# Patient Record
Sex: Female | Born: 1985 | Race: Black or African American | Hispanic: No | Marital: Married | Smoking: Never smoker
Health system: Southern US, Community
[De-identification: ages and names within clinical notes are randomized; demographics above are authoritative.]

## PROBLEM LIST (undated history)

## (undated) DIAGNOSIS — R519 Headache, unspecified: Secondary | ICD-10-CM

## (undated) DIAGNOSIS — R12 Heartburn: Secondary | ICD-10-CM

## (undated) DIAGNOSIS — O139 Gestational [pregnancy-induced] hypertension without significant proteinuria, unspecified trimester: Secondary | ICD-10-CM

## (undated) DIAGNOSIS — R51 Headache: Secondary | ICD-10-CM

## (undated) DIAGNOSIS — O26899 Other specified pregnancy related conditions, unspecified trimester: Secondary | ICD-10-CM

---

## 2009-10-09 ENCOUNTER — Inpatient Hospital Stay (HOSPITAL_COMMUNITY)
Admission: AD | Admit: 2009-10-09 | Discharge: 2009-10-12 | Payer: Self-pay | Source: Home / Self Care | Admitting: Obstetrics

## 2009-10-09 ENCOUNTER — Encounter (INDEPENDENT_AMBULATORY_CARE_PROVIDER_SITE_OTHER): Payer: Self-pay | Admitting: Obstetrics

## 2009-10-12 ENCOUNTER — Encounter: Admission: RE | Admit: 2009-10-12 | Discharge: 2009-11-11 | Payer: Self-pay | Admitting: Obstetrics

## 2009-11-12 ENCOUNTER — Encounter
Admission: RE | Admit: 2009-11-12 | Discharge: 2009-12-06 | Payer: Self-pay | Source: Home / Self Care | Admitting: Obstetrics

## 2010-03-22 LAB — COMPREHENSIVE METABOLIC PANEL
ALT: 26 U/L (ref 0–35)
ALT: 36 U/L — ABNORMAL HIGH (ref 0–35)
ALT: 40 U/L — ABNORMAL HIGH (ref 0–35)
AST: 45 U/L — ABNORMAL HIGH (ref 0–37)
AST: 57 U/L — ABNORMAL HIGH (ref 0–37)
AST: 70 U/L — ABNORMAL HIGH (ref 0–37)
Albumin: 1.8 g/dL — ABNORMAL LOW (ref 3.5–5.2)
Albumin: 2.1 g/dL — ABNORMAL LOW (ref 3.5–5.2)
Albumin: 2.3 g/dL — ABNORMAL LOW (ref 3.5–5.2)
Alkaline Phosphatase: 141 U/L — ABNORMAL HIGH (ref 39–117)
Alkaline Phosphatase: 177 U/L — ABNORMAL HIGH (ref 39–117)
Alkaline Phosphatase: 208 U/L — ABNORMAL HIGH (ref 39–117)
BUN: 10 mg/dL (ref 6–23)
BUN: 12 mg/dL (ref 6–23)
BUN: 9 mg/dL (ref 6–23)
CO2: 18 mEq/L — ABNORMAL LOW (ref 19–32)
CO2: 20 mEq/L (ref 19–32)
CO2: 25 mEq/L (ref 19–32)
Calcium: 6.7 mg/dL — ABNORMAL LOW (ref 8.4–10.5)
Calcium: 6.7 mg/dL — ABNORMAL LOW (ref 8.4–10.5)
Calcium: 7.8 mg/dL — ABNORMAL LOW (ref 8.4–10.5)
Chloride: 107 mEq/L (ref 96–112)
Chloride: 108 mEq/L (ref 96–112)
Chloride: 108 mEq/L (ref 96–112)
Creatinine, Ser: 1.29 mg/dL — ABNORMAL HIGH (ref 0.4–1.2)
Creatinine, Ser: 1.35 mg/dL — ABNORMAL HIGH (ref 0.4–1.2)
Creatinine, Ser: 1.36 mg/dL — ABNORMAL HIGH (ref 0.4–1.2)
GFR calc Af Amer: 58 mL/min — ABNORMAL LOW (ref >60)
GFR calc Af Amer: 59 mL/min — ABNORMAL LOW (ref >60)
GFR calc Af Amer: >60 mL/min (ref >60)
GFR calc non Af Amer: 48 mL/min — ABNORMAL LOW (ref >60)
GFR calc non Af Amer: 49 mL/min — ABNORMAL LOW (ref >60)
GFR calc non Af Amer: 51 mL/min — ABNORMAL LOW (ref >60)
Glucose, Bld: 79 mg/dL (ref 70–99)
Glucose, Bld: 83 mg/dL (ref 70–99)
Glucose, Bld: 92 mg/dL (ref 70–99)
Potassium: 3.7 mEq/L (ref 3.5–5.1)
Potassium: 4.6 mEq/L (ref 3.5–5.1)
Potassium: 4.7 mEq/L (ref 3.5–5.1)
Sodium: 132 mEq/L — ABNORMAL LOW (ref 135–145)
Sodium: 133 mEq/L — ABNORMAL LOW (ref 135–145)
Sodium: 135 mEq/L (ref 135–145)
Total Bilirubin: 0.1 mg/dL — ABNORMAL LOW (ref 0.3–1.2)
Total Bilirubin: 0.5 mg/dL (ref 0.3–1.2)
Total Bilirubin: 0.8 mg/dL (ref 0.3–1.2)
Total Protein: 4.9 g/dL — ABNORMAL LOW (ref 6.0–8.3)
Total Protein: 5.6 g/dL — ABNORMAL LOW (ref 6.0–8.3)
Total Protein: 6.2 g/dL (ref 6.0–8.3)

## 2010-03-22 LAB — CBC
HCT: 32.5 % — ABNORMAL LOW (ref 36.0–46.0)
HCT: 39.1 % (ref 36.0–46.0)
HCT: 42.4 % (ref 36.0–46.0)
Hemoglobin: 11.1 g/dL — ABNORMAL LOW (ref 12.0–15.0)
Hemoglobin: 13 g/dL (ref 12.0–15.0)
Hemoglobin: 14.2 g/dL (ref 12.0–15.0)
MCH: 31.8 pg (ref 26.0–34.0)
MCH: 31.9 pg (ref 26.0–34.0)
MCH: 32.5 pg (ref 26.0–34.0)
MCHC: 33.3 g/dL (ref 30.0–36.0)
MCHC: 33.6 g/dL (ref 30.0–36.0)
MCHC: 34.2 g/dL (ref 30.0–36.0)
MCV: 95 fL (ref 78.0–100.0)
MCV: 95.1 fL (ref 78.0–100.0)
MCV: 95.6 fL (ref 78.0–100.0)
Platelets: 120 10*3/uL — ABNORMAL LOW (ref 150–400)
Platelets: 122 10*3/uL — ABNORMAL LOW (ref 150–400)
Platelets: 124 10*3/uL — ABNORMAL LOW (ref 150–400)
RBC: 3.42 MIL/uL — ABNORMAL LOW (ref 3.87–5.11)
RBC: 4.09 MIL/uL (ref 3.87–5.11)
RBC: 4.46 MIL/uL (ref 3.87–5.11)
RDW: 14.9 % (ref 11.5–15.5)
RDW: 15.2 % (ref 11.5–15.5)
RDW: 15.3 % (ref 11.5–15.5)
WBC: 21.3 10*3/uL — ABNORMAL HIGH (ref 4.0–10.5)
WBC: 23.9 10*3/uL — ABNORMAL HIGH (ref 4.0–10.5)
WBC: 7.2 10*3/uL (ref 4.0–10.5)

## 2010-03-22 LAB — MAGNESIUM
Magnesium: 4.2 mg/dL — ABNORMAL HIGH (ref 1.5–2.5)
Magnesium: 8.5 mg/dL (ref 1.5–2.5)

## 2010-03-22 LAB — RPR: RPR Ser Ql: NONREACTIVE

## 2010-03-22 LAB — LACTATE DEHYDROGENASE
LDH: 205 U/L (ref 94–250)
LDH: 274 U/L — ABNORMAL HIGH (ref 94–250)
LDH: 384 U/L — ABNORMAL HIGH (ref 94–250)

## 2010-03-22 LAB — MRSA PCR SCREENING: MRSA by PCR: NEGATIVE

## 2010-03-22 LAB — URIC ACID
Uric Acid, Serum: 10.5 mg/dL — ABNORMAL HIGH (ref 2.4–7.0)
Uric Acid, Serum: 10.6 mg/dL — ABNORMAL HIGH (ref 2.4–7.0)
Uric Acid, Serum: 11.5 mg/dL — ABNORMAL HIGH (ref 2.4–7.0)

## 2010-08-24 LAB — RPR: RPR: NONREACTIVE

## 2010-08-24 LAB — HEPATITIS B SURFACE ANTIGEN: Hepatitis B Surface Ag: NEGATIVE

## 2010-11-28 ENCOUNTER — Encounter (HOSPITAL_COMMUNITY): Payer: Self-pay | Admitting: *Deleted

## 2010-11-28 ENCOUNTER — Other Ambulatory Visit: Payer: Self-pay | Admitting: Obstetrics

## 2010-11-28 ENCOUNTER — Inpatient Hospital Stay (HOSPITAL_COMMUNITY)
Admission: AD | Admit: 2010-11-28 | Discharge: 2010-12-03 | DRG: 765 | Disposition: A | Discharge status: Home / Self Care | Payer: Medicaid Other | Source: Ambulatory Visit | Attending: Obstetrics | Admitting: Obstetrics

## 2010-11-28 DIAGNOSIS — O34219 Maternal care for unspecified type scar from previous cesarean delivery: Principal | ICD-10-CM | POA: Diagnosis present

## 2010-11-28 DIAGNOSIS — O365990 Maternal care for other known or suspected poor fetal growth, unspecified trimester, not applicable or unspecified: Secondary | ICD-10-CM | POA: Diagnosis present

## 2010-11-28 DIAGNOSIS — O139 Gestational [pregnancy-induced] hypertension without significant proteinuria, unspecified trimester: Secondary | ICD-10-CM | POA: Diagnosis present

## 2010-11-28 DIAGNOSIS — Z98891 History of uterine scar from previous surgery: Secondary | ICD-10-CM

## 2010-11-28 DIAGNOSIS — O1490 Unspecified pre-eclampsia, unspecified trimester: Secondary | ICD-10-CM

## 2010-11-28 HISTORY — DX: Gestational (pregnancy-induced) hypertension without significant proteinuria, unspecified trimester: O13.9

## 2010-11-28 LAB — CBC
HCT: 40.2 % (ref 36.0–46.0)
Hemoglobin: 13.8 g/dL (ref 12.0–15.0)
MCH: 31.3 pg (ref 26.0–34.0)
MCHC: 34.3 g/dL (ref 30.0–36.0)
RBC: 4.41 MIL/uL (ref 3.87–5.11)

## 2010-11-28 LAB — LACTATE DEHYDROGENASE: LDH: 243 U/L (ref 94–250)

## 2010-11-28 LAB — COMPREHENSIVE METABOLIC PANEL
ALT: 11 U/L (ref 0–35)
Alkaline Phosphatase: 135 U/L — ABNORMAL HIGH (ref 39–117)
BUN: 12 mg/dL (ref 6–23)
CO2: 18 mEq/L — ABNORMAL LOW (ref 19–32)
Calcium: 9 mg/dL (ref 8.4–10.5)
GFR calc Af Amer: >90 mL/min (ref >90)
GFR calc non Af Amer: >90 mL/min (ref >90)
Glucose, Bld: 75 mg/dL (ref 70–99)
Potassium: 4.3 mEq/L (ref 3.5–5.1)
Sodium: 134 mEq/L — ABNORMAL LOW (ref 135–145)
Total Protein: 6.6 g/dL (ref 6.0–8.3)

## 2010-11-28 MED ORDER — ACETAMINOPHEN 325 MG PO TABS: 650.0000 mg | ORAL_TABLET | ORAL | Status: DC | PRN

## 2010-11-28 MED ORDER — CALCIUM CARBONATE ANTACID 500 MG PO CHEW: 2.0000 | CHEWABLE_TABLET | ORAL | Status: DC | PRN

## 2010-11-28 MED ORDER — PRENATAL PLUS 27-1 MG PO TABS
1.0000 | ORAL_TABLET | Freq: Every day | ORAL | Status: DC
  Administered 2010-11-28 – 2010-11-29 (×2): 1 via ORAL
  Filled 2010-11-28 (×2): qty 1

## 2010-11-28 MED ORDER — ZOLPIDEM TARTRATE 10 MG PO TABS
10.0000 mg | ORAL_TABLET | Freq: Every evening | ORAL | Status: DC | PRN
  Administered 2010-11-29: 10 mg via ORAL
  Filled 2010-11-28: qty 1

## 2010-11-28 MED ORDER — MAGNESIUM SULFATE BOLUS VIA INFUSION
4.0000 g | Freq: Once | INTRAVENOUS | Status: AC
  Administered 2010-11-28: 4 g via INTRAVENOUS
  Filled 2010-11-28: qty 500

## 2010-11-28 MED ORDER — BETAMETHASONE SOD PHOS & ACET 6 (3-3) MG/ML IJ SUSP
12.0000 mg | Freq: Once | INTRAMUSCULAR | Status: AC
  Administered 2010-11-29: 12 mg via INTRAMUSCULAR
  Filled 2010-11-28: qty 2

## 2010-11-28 MED ORDER — MAGNESIUM SULFATE 40 G IN LACTATED RINGERS - SIMPLE
2.0000 g/h | INTRAVENOUS | Status: DC
  Administered 2010-11-28 – 2010-11-29 (×2): 2 g/h via INTRAVENOUS
  Filled 2010-11-28 (×3): qty 500

## 2010-11-28 MED ORDER — BETAMETHASONE SOD PHOS & ACET 6 (3-3) MG/ML IJ SUSP
12.0000 mg | Freq: Once | INTRAMUSCULAR | Status: AC
  Administered 2010-11-28: 12 mg via INTRAMUSCULAR
  Filled 2010-11-28: qty 2

## 2010-11-28 MED ORDER — LABETALOL HCL 5 MG/ML IV SOLN
40.0000 mg | Freq: Once | INTRAVENOUS | Status: AC | PRN
  Filled 2010-11-28: qty 8

## 2010-11-28 MED ORDER — DOCUSATE SODIUM 100 MG PO CAPS
100.0000 mg | ORAL_CAPSULE | Freq: Every day | ORAL | Status: DC
  Administered 2010-11-28 – 2010-11-29 (×2): 100 mg via ORAL
  Filled 2010-11-28 (×2): qty 1

## 2010-11-28 MED ORDER — LACTATED RINGERS IV SOLN
INTRAVENOUS | Status: AC
  Administered 2010-11-28: 100 mL/h via INTRAVENOUS
  Administered 2010-11-29 (×2): via INTRAVENOUS

## 2010-11-28 NOTE — H&P (Signed)
This is Dr. Francoise Ceo dictating the history and physical on  Kathryn Valentine obese she's a 25 year old gravida 2 para 0101 who had a previous C-section because of PIH she is now [redacted] weeks pregnant her due date is 01/16/2011 GBS unknown she was seen in the office today with a blood pressure of on  150/100 with 3+ proteinuria and she was brought in for a workup 24 urine creatinine she was also started on magnesium sulfate 4 g loading and 2 g per hour ultrasound will be obtained tomorrow because it appears that there might be some IUGR and MFM   consult to be obtained   past medical history she had PIH and her previous pregnancy Past surgical history she had a previous C-section Social history negative System review negative Physical exam Well-developed female in no distress HEENT negative Lungs clear Breasts negative Abdomen measures 31 cm Cervix closed Extremities negative

## 2010-11-29 ENCOUNTER — Inpatient Hospital Stay (HOSPITAL_COMMUNITY): Payer: Medicaid Other

## 2010-11-29 DIAGNOSIS — O1490 Unspecified pre-eclampsia, unspecified trimester: Secondary | ICD-10-CM | POA: Diagnosis present

## 2010-11-29 LAB — CREATININE CLEARANCE, URINE, 24 HOUR
Collection Interval-CRCL: 24 hours
Creatinine Clearance: 103 mL/min (ref 75–115)
Creatinine, 24H Ur: 1286 mg/d (ref 700–1800)
Creatinine, Urine: 40.19 mg/dL
Urine Total Volume-CRCL: 3200 mL

## 2010-11-29 MED ORDER — DEXTROSE IN LACTATED RINGERS 5 % IV SOLN
INTRAVENOUS | Status: DC
  Administered 2010-11-30: via INTRAVENOUS

## 2010-11-29 NOTE — Progress Notes (Addendum)
Patient ID: Kathryn Valentine, female   DOB: 12/22/1985, 25 y.o.   MRN: [id number] S: No complaints  O: Blood pressure 132/81, pulse 89, temperature 97.8 F (36.6 C), temperature source Oral, resp. rate 24, height 5\' 8"  (1.727 m), weight 83.915 kg (185 lb), SpO2 100.00%.   WUJ:WJXBJYNW: 140 bpm, Variability: Good {> 6 bpm), Accelerations: Non-reactive but appropriate for gestational age and Decelerations: Variable: mild Toco: None  11/21 0701 - 11/22 0700 In: 2752.1 [P.O.:900; I.V.:1852.1] Out: 2550 [Urine:2550]    A/P- 25 y.o. admitted with pre-eclampsia.  Stable at present on MgSO4 Dating:  [redacted]w[redacted]d Check 24 hr urine Decrease IVF  U/S reviewed.  EFW <10%; symmetrical IUGR, borderline low AFI, elevated U/A dopplers  D/W Dr Sherrie George, MFM.  She recommends delivery by repeat C/D in AM.  Plan reviewed with pt and spouse

## 2010-11-30 ENCOUNTER — Encounter (HOSPITAL_COMMUNITY): Payer: Self-pay | Admitting: Anesthesiology

## 2010-11-30 ENCOUNTER — Encounter (HOSPITAL_COMMUNITY): Payer: Self-pay | Admitting: *Deleted

## 2010-11-30 ENCOUNTER — Inpatient Hospital Stay (HOSPITAL_COMMUNITY): Payer: Medicaid Other | Admitting: Anesthesiology

## 2010-11-30 ENCOUNTER — Encounter (HOSPITAL_COMMUNITY)
Admission: AD | Disposition: A | Discharge status: Home / Self Care | Payer: Self-pay | Source: Ambulatory Visit | Attending: Obstetrics

## 2010-11-30 ENCOUNTER — Other Ambulatory Visit: Payer: Self-pay | Admitting: Obstetrics

## 2010-11-30 LAB — COMPREHENSIVE METABOLIC PANEL
ALT: 11 U/L (ref 0–35)
AST: 20 U/L (ref 0–37)
Alkaline Phosphatase: 131 U/L — ABNORMAL HIGH (ref 39–117)
CO2: 21 mEq/L (ref 19–32)
Chloride: 101 mEq/L (ref 96–112)
GFR calc non Af Amer: >90 mL/min (ref >90)
Potassium: 4.2 mEq/L (ref 3.5–5.1)
Sodium: 130 mEq/L — ABNORMAL LOW (ref 135–145)
Total Bilirubin: 0.1 mg/dL — ABNORMAL LOW (ref 0.3–1.2)

## 2010-11-30 LAB — CBC
Hemoglobin: 12.6 g/dL (ref 12.0–15.0)
MCV: 90.5 fL (ref 78.0–100.0)
Platelets: 187 10*3/uL (ref 150–400)

## 2010-11-30 LAB — PROTEIN, URINE, 24 HOUR: Protein, Urine: 40 mg/dL

## 2010-11-30 SURGERY — Surgical Case
Anesthesia: Regional | Site: Abdomen | Wound class: Clean Contaminated

## 2010-11-30 MED ORDER — SENNOSIDES-DOCUSATE SODIUM 8.6-50 MG PO TABS
2.0000 | ORAL_TABLET | Freq: Every day | ORAL | Status: DC
  Administered 2010-11-30 – 2010-12-01 (×2): 2 via ORAL

## 2010-11-30 MED ORDER — DIPHENHYDRAMINE HCL 25 MG PO CAPS
25.0000 mg | ORAL_CAPSULE | ORAL | Status: DC | PRN
  Administered 2010-11-30 – 2010-12-01 (×2): 25 mg via ORAL
  Filled 2010-11-30 (×2): qty 1

## 2010-11-30 MED ORDER — KETOROLAC TROMETHAMINE 30 MG/ML IJ SOLN: 30.0000 mg | Freq: Four times a day (QID) | INTRAMUSCULAR | Status: AC | PRN

## 2010-11-30 MED ORDER — HYDROMORPHONE HCL PF 1 MG/ML IJ SOLN: 0.2500 mg | INTRAMUSCULAR | Status: DC | PRN

## 2010-11-30 MED ORDER — MEPERIDINE HCL 25 MG/ML IJ SOLN: 6.2500 mg | INTRAMUSCULAR | Status: DC | PRN

## 2010-11-30 MED ORDER — OXYTOCIN 10 UNIT/ML IJ SOLN
INTRAMUSCULAR | Status: AC
  Filled 2010-11-30: qty 2

## 2010-11-30 MED ORDER — SCOPOLAMINE 1 MG/3DAYS TD PT72
1.0000 | MEDICATED_PATCH | Freq: Once | TRANSDERMAL | Status: AC
  Administered 2010-11-30: 1.5 mg via TRANSDERMAL

## 2010-11-30 MED ORDER — MORPHINE SULFATE 0.5 MG/ML IJ SOLN
INTRAMUSCULAR | Status: AC
  Filled 2010-11-30: qty 10

## 2010-11-30 MED ORDER — ONDANSETRON HCL 4 MG/2ML IJ SOLN
INTRAMUSCULAR | Status: DC | PRN
  Administered 2010-11-30: 4 mg via INTRAVENOUS

## 2010-11-30 MED ORDER — KETOROLAC TROMETHAMINE 60 MG/2ML IM SOLN
60.0000 mg | Freq: Once | INTRAMUSCULAR | Status: AC | PRN
  Administered 2010-11-30: 60 mg via INTRAMUSCULAR

## 2010-11-30 MED ORDER — SODIUM CHLORIDE 0.9 % IJ SOLN: 3.0000 mL | INTRAMUSCULAR | Status: DC | PRN

## 2010-11-30 MED ORDER — SIMETHICONE 80 MG PO CHEW
80.0000 mg | CHEWABLE_TABLET | Freq: Three times a day (TID) | ORAL | Status: DC
  Administered 2010-11-30 – 2010-12-02 (×8): 80 mg via ORAL

## 2010-11-30 MED ORDER — DIPHENHYDRAMINE HCL 25 MG PO CAPS: 25.0000 mg | ORAL_CAPSULE | Freq: Four times a day (QID) | ORAL | Status: DC | PRN

## 2010-11-30 MED ORDER — SODIUM CHLORIDE 0.9 % IV SOLN: 1.0000 ug/kg/h | INTRAVENOUS | Status: DC | PRN

## 2010-11-30 MED ORDER — OXYCODONE-ACETAMINOPHEN 5-325 MG PO TABS
1.0000 | ORAL_TABLET | ORAL | Status: DC | PRN
  Administered 2010-12-01: 1 via ORAL
  Administered 2010-12-02: 2 via ORAL
  Filled 2010-11-30: qty 1
  Filled 2010-11-30: qty 2
  Filled 2010-11-30: qty 1

## 2010-11-30 MED ORDER — SIMETHICONE 80 MG PO CHEW: 80.0000 mg | CHEWABLE_TABLET | ORAL | Status: DC | PRN

## 2010-11-30 MED ORDER — OXYTOCIN 20 UNITS IN LACTATED RINGERS INFUSION - SIMPLE
INTRAVENOUS | Status: DC | PRN
  Administered 2010-11-30 (×2): 20 [IU] via INTRAVENOUS

## 2010-11-30 MED ORDER — ZOLPIDEM TARTRATE 5 MG PO TABS: 5.0000 mg | ORAL_TABLET | Freq: Every evening | ORAL | Status: DC | PRN

## 2010-11-30 MED ORDER — DIBUCAINE 1 % RE OINT: 1.0000 "application " | TOPICAL_OINTMENT | RECTAL | Status: DC | PRN

## 2010-11-30 MED ORDER — IBUPROFEN 600 MG PO TABS
600.0000 mg | ORAL_TABLET | Freq: Four times a day (QID) | ORAL | Status: DC
  Administered 2010-11-30 – 2010-12-03 (×11): 600 mg via ORAL
  Filled 2010-11-30 (×11): qty 1

## 2010-11-30 MED ORDER — IBUPROFEN 600 MG PO TABS: 600.0000 mg | ORAL_TABLET | Freq: Four times a day (QID) | ORAL | Status: DC | PRN

## 2010-11-30 MED ORDER — MORPHINE SULFATE (PF) 0.5 MG/ML IJ SOLN
INTRAMUSCULAR | Status: DC | PRN
  Administered 2010-11-30: 200 ug via INTRATHECAL

## 2010-11-30 MED ORDER — PROMETHAZINE HCL 25 MG/ML IJ SOLN: 6.2500 mg | INTRAMUSCULAR | Status: DC | PRN

## 2010-11-30 MED ORDER — BUPIVACAINE IN DEXTROSE 0.75-8.25 % IT SOLN
INTRATHECAL | Status: DC | PRN
  Administered 2010-11-30: 10.5 mg via INTRATHECAL

## 2010-11-30 MED ORDER — ONDANSETRON HCL 4 MG PO TABS: 4.0000 mg | ORAL_TABLET | ORAL | Status: DC | PRN

## 2010-11-30 MED ORDER — NALOXONE HCL 0.4 MG/ML IJ SOLN: 0.4000 mg | INTRAMUSCULAR | Status: DC | PRN

## 2010-11-30 MED ORDER — ONDANSETRON HCL 4 MG/2ML IJ SOLN: 4.0000 mg | INTRAMUSCULAR | Status: DC | PRN

## 2010-11-30 MED ORDER — MENTHOL 3 MG MT LOZG: 1.0000 | LOZENGE | OROMUCOSAL | Status: DC | PRN

## 2010-11-30 MED ORDER — EPHEDRINE SULFATE 50 MG/ML IJ SOLN
INTRAMUSCULAR | Status: DC | PRN
  Administered 2010-11-30 (×2): 10 mg via INTRAVENOUS

## 2010-11-30 MED ORDER — INFLUENZA VIRUS VACC SPLIT PF IM SUSP
0.5000 mL | INTRAMUSCULAR | Status: AC
  Administered 2010-12-01: 0.5 mL via INTRAMUSCULAR
  Filled 2010-11-30: qty 0.5

## 2010-11-30 MED ORDER — DIPHENHYDRAMINE HCL 50 MG/ML IJ SOLN: 25.0000 mg | INTRAMUSCULAR | Status: DC | PRN

## 2010-11-30 MED ORDER — LACTATED RINGERS IV SOLN
INTRAVENOUS | Status: DC
  Administered 2010-11-30 – 2010-12-01 (×2): via INTRAVENOUS

## 2010-11-30 MED ORDER — SCOPOLAMINE 1 MG/3DAYS TD PT72
MEDICATED_PATCH | TRANSDERMAL | Status: AC
  Administered 2010-11-30: 1.5 mg via TRANSDERMAL
  Filled 2010-11-30: qty 1

## 2010-11-30 MED ORDER — ONDANSETRON HCL 4 MG/2ML IJ SOLN: 4.0000 mg | Freq: Three times a day (TID) | INTRAMUSCULAR | Status: DC | PRN

## 2010-11-30 MED ORDER — CITRIC ACID-SODIUM CITRATE 334-500 MG/5ML PO SOLN
ORAL | Status: AC
  Administered 2010-11-30: 30 mL
  Filled 2010-11-30: qty 15

## 2010-11-30 MED ORDER — EPHEDRINE 5 MG/ML INJ
INTRAVENOUS | Status: AC
  Filled 2010-11-30: qty 10

## 2010-11-30 MED ORDER — OXYTOCIN 20 UNITS IN LACTATED RINGERS INFUSION - SIMPLE
125.0000 mL/h | INTRAVENOUS | Status: AC
  Filled 2010-11-30: qty 1000

## 2010-11-30 MED ORDER — TETANUS-DIPHTH-ACELL PERTUSSIS 5-2.5-18.5 LF-MCG/0.5 IM SUSP
0.5000 mL | Freq: Once | INTRAMUSCULAR | Status: AC
  Administered 2010-12-01: 0.5 mL via INTRAMUSCULAR
  Filled 2010-11-30: qty 0.5

## 2010-11-30 MED ORDER — MUPIROCIN 2 % EX OINT
1.0000 "application " | TOPICAL_OINTMENT | Freq: Two times a day (BID) | CUTANEOUS | Status: DC
  Administered 2010-11-30 – 2010-12-02 (×5): 1 via NASAL
  Filled 2010-11-30: qty 22

## 2010-11-30 MED ORDER — PRENATAL PLUS 27-1 MG PO TABS
1.0000 | ORAL_TABLET | Freq: Every day | ORAL | Status: DC
  Administered 2010-12-01 – 2010-12-03 (×3): 1 via ORAL
  Filled 2010-11-30 (×3): qty 1

## 2010-11-30 MED ORDER — DIPHENHYDRAMINE HCL 50 MG/ML IJ SOLN: 12.5000 mg | INTRAMUSCULAR | Status: DC | PRN

## 2010-11-30 MED ORDER — LANOLIN HYDROUS EX OINT: 1.0000 "application " | TOPICAL_OINTMENT | CUTANEOUS | Status: DC | PRN

## 2010-11-30 MED ORDER — ONDANSETRON HCL 4 MG/2ML IJ SOLN
INTRAMUSCULAR | Status: AC
  Filled 2010-11-30: qty 2

## 2010-11-30 MED ORDER — CHLORHEXIDINE GLUCONATE CLOTH 2 % EX PADS
6.0000 | MEDICATED_PAD | Freq: Every day | CUTANEOUS | Status: DC
  Administered 2010-11-30 – 2010-12-02 (×3): 6 via TOPICAL

## 2010-11-30 MED ORDER — LACTATED RINGERS IV SOLN
INTRAVENOUS | Status: DC | PRN
  Administered 2010-11-30 (×2): via INTRAVENOUS

## 2010-11-30 MED ORDER — KETOROLAC TROMETHAMINE 60 MG/2ML IM SOLN
INTRAMUSCULAR | Status: AC
  Administered 2010-11-30: 60 mg via INTRAMUSCULAR
  Filled 2010-11-30: qty 2

## 2010-11-30 MED ORDER — NALBUPHINE HCL 10 MG/ML IJ SOLN
5.0000 mg | INTRAMUSCULAR | Status: DC | PRN
  Administered 2010-11-30: 10 mg via SUBCUTANEOUS
  Filled 2010-11-30 (×2): qty 1

## 2010-11-30 MED ORDER — WITCH HAZEL-GLYCERIN EX PADS: 1.0000 "application " | MEDICATED_PAD | CUTANEOUS | Status: DC | PRN

## 2010-11-30 MED ORDER — KETOROLAC TROMETHAMINE 30 MG/ML IJ SOLN: 15.0000 mg | Freq: Once | INTRAMUSCULAR | Status: DC | PRN

## 2010-11-30 MED ORDER — PHENYLEPHRINE 40 MCG/ML (10ML) SYRINGE FOR IV PUSH (FOR BLOOD PRESSURE SUPPORT)
PREFILLED_SYRINGE | INTRAVENOUS | Status: AC
  Filled 2010-11-30: qty 5

## 2010-11-30 MED ORDER — CEFAZOLIN SODIUM 1-5 GM-% IV SOLN
1.0000 g | Freq: Three times a day (TID) | INTRAVENOUS | Status: AC
  Administered 2010-11-30 (×3): 1 g via INTRAVENOUS
  Filled 2010-11-30 (×3): qty 50

## 2010-11-30 MED ORDER — NALBUPHINE HCL 10 MG/ML IJ SOLN
5.0000 mg | INTRAMUSCULAR | Status: DC | PRN
  Administered 2010-11-30 (×2): 5 mg via INTRAVENOUS

## 2010-11-30 MED ORDER — FENTANYL CITRATE 0.05 MG/ML IJ SOLN
INTRAMUSCULAR | Status: DC | PRN
  Administered 2010-11-30: 12.5 ug via INTRATHECAL
  Administered 2010-11-30: 87.5 ug via INTRAVENOUS

## 2010-11-30 MED ORDER — FENTANYL CITRATE 0.05 MG/ML IJ SOLN
INTRAMUSCULAR | Status: AC
  Filled 2010-11-30: qty 2

## 2010-11-30 SURGICAL SUPPLY — 26 items
CHLORAPREP W/TINT 26ML (MISCELLANEOUS) ×2 IMPLANT
CLOTH BEACON ORANGE TIMEOUT ST (SAFETY) ×2 IMPLANT
DERMABOND ADVANCED (GAUZE/BANDAGES/DRESSINGS) ×1
DERMABOND ADVANCED .7 DNX12 (GAUZE/BANDAGES/DRESSINGS) ×1 IMPLANT
ELECT REM PT RETURN 9FT ADLT (ELECTROSURGICAL) ×2
ELECTRODE REM PT RTRN 9FT ADLT (ELECTROSURGICAL) ×1 IMPLANT
EXTRACTOR VACUUM M CUP 4 TUBE (SUCTIONS) IMPLANT
GLOVE BIO SURGEON STRL SZ8.5 (GLOVE) ×4 IMPLANT
GOWN PREVENTION PLUS LG XLONG (DISPOSABLE) ×4 IMPLANT
GOWN PREVENTION PLUS XXLARGE (GOWN DISPOSABLE) ×2 IMPLANT
KIT ABG SYR 3ML LUER SLIP (SYRINGE) IMPLANT
NEEDLE HYPO 25X5/8 SAFETYGLIDE (NEEDLE) ×2 IMPLANT
NS IRRIG 1000ML POUR BTL (IV SOLUTION) ×2 IMPLANT
PACK C SECTION WH (CUSTOM PROCEDURE TRAY) ×2 IMPLANT
SLEEVE SCD COMPRESS KNEE MED (MISCELLANEOUS) IMPLANT
SUT CHROMIC 0 CT 802H (SUTURE) ×2 IMPLANT
SUT CHROMIC 1 CTX 36 (SUTURE) ×4 IMPLANT
SUT GUT PLAIN 0 CT-3 TAN 27 (SUTURE) IMPLANT
SUT MON AB 4-0 PS1 27 (SUTURE) ×2 IMPLANT
SUT VIC AB 0 CT1 18XCR BRD8 (SUTURE) IMPLANT
SUT VIC AB 0 CT1 8-18 (SUTURE)
SUT VIC AB 0 CTX 36 (SUTURE) ×2
SUT VIC AB 0 CTX36XBRD ANBCTRL (SUTURE) ×2 IMPLANT
TOWEL OR 17X24 6PK STRL BLUE (TOWEL DISPOSABLE) ×4 IMPLANT
TRAY FOLEY CATH 14FR (SET/KITS/TRAYS/PACK) ×2 IMPLANT
WATER STERILE IRR 1000ML POUR (IV SOLUTION) ×2 IMPLANT

## 2010-11-30 NOTE — Anesthesia Procedure Notes (Signed)
Spinal  Patient location during procedure: OR Start time: 11/30/2010 8:42 AM End time: 11/30/2010 8:45 AM Staffing Anesthesiologist: Sandrea Hughs Performed by: anesthesiologist  Preanesthetic Checklist Completed: patient identified, site marked, surgical consent, pre-op evaluation, timeout performed, IV checked, risks and benefits discussed and monitors and equipment checked Spinal Block Patient position: sitting Prep: DuraPrep Patient monitoring: heart rate, cardiac monitor, continuous pulse ox and blood pressure Approach: midline Location: L3-4 Injection technique: single-shot Needle Needle type: Sprotte  Needle gauge: 24 G Needle length: 9 cm Needle insertion depth: 5 cm Assessment Sensory level: T4

## 2010-11-30 NOTE — Anesthesia Postprocedure Evaluation (Signed)
Anesthesia Post Note  Patient: Kathryn Valentine  Procedure(s) Performed:  CESAREAN SECTION  Anesthesia type: Spinal  Patient location: PACU  Post pain: Pain level controlled  Post assessment: Post-op Vital signs reviewed  Last Vitals:  Filed Vitals:   11/30/10 0748  BP: 146/81  Pulse: 78  Temp: 36.5 C  Resp: 16    Post vital signs: Reviewed  Level of consciousness: awake  Complications: No apparent anesthesia complications

## 2010-11-30 NOTE — Progress Notes (Signed)
UR Chart review completed.  

## 2010-11-30 NOTE — Progress Notes (Signed)
Patient ID: Kathryn Valentine, female   DOB: [birth year], 25 y.o.   MRN: [id number] 24 hour urine collection 1200 gms MFM consult was obtained and the patient wanted to have a repeat section MFM suggested delivery at this time because of her preeclampsia and also IUGR so that will be performed today hemoglobins 12.6 platelets 186 Patient still on her magnesium sulfate December continued postop

## 2010-11-30 NOTE — Transfer of Care (Signed)
Immediate Anesthesia Transfer of Care Note  Patient: Kathryn Valentine  Procedure(s) Performed:  CESAREAN SECTION  Patient Location: PACU  Anesthesia Type: Spinal  Level of Consciousness: awake, alert  and oriented  Airway & Oxygen Therapy: Patient Spontanous Breathing  Post-op Assessment: Report given to PACU RN and Post -op Vital signs reviewed and stable  Post vital signs: Reviewed and stable  Complications: No apparent anesthesia complications

## 2010-11-30 NOTE — Op Note (Signed)
Preop diagnosis previous cesarean section at 34 weeks because of PIH patient now has a PIH and on inducible postop diagnosis repeat low transverse cesarean section at 33+ weeks because of PIH Anesthesia spinal Surgeon Dr. Francoise Ceo Procedure After the spinal administered abdomen prepped and draped data entered with a Foley catheter a transverse incision made through the old scar carried down to the rectus fascia fascia cleaned and incised the length of the incision recti muscles retracted laterally peritoneum incised longitudinally a transverse incision made on the visceroperitoneum above the bladder and the bladder mobilized inferiorly transverse lower you you aren't uterine incision made fluid was clear patient delivered of all female Apgar 7 and 8 and the weight was 1190 g the placenta was posterior mold magnum sent to pathology uterine cavity clean with dry laps uterine incision closed in 2 layers with continuous within normal on chromic bladder flap reattached with 2-0 chromic uterus well contracted tubes and ovaries normal abdomen chosen as peritoneum continuous with 2-0 chromic fascia continuous with of 0 Dexon and the skin shows a subcuticular stitch of 4-0 Monocryl blood loss was 500 cc and

## 2010-11-30 NOTE — Anesthesia Preprocedure Evaluation (Signed)
Anesthesia Evaluation  Patient identified by MRN, date of birth, ID band Patient awake    Reviewed: Allergy & Precautions, H&P , Patient's Chart, lab work & pertinent test results  Airway Mallampati: II TM Distance: >3 FB Neck ROM: full    Dental No notable dental hx.    Pulmonary neg pulmonary ROS,    Pulmonary exam normal       Cardiovascular     Neuro/Psych Negative Neurological ROS  Negative Psych ROS   GI/Hepatic negative GI ROS, Neg liver ROS,   Endo/Other  Negative Endocrine ROS  Renal/GU negative Renal ROS  Genitourinary negative   Musculoskeletal negative musculoskeletal ROS (+)   Abdominal Normal abdominal exam  (+)   Peds negative pediatric ROS (+)  Hematology negative hematology ROS (+)   Anesthesia Other Findings   Reproductive/Obstetrics (+) Pregnancy                           Anesthesia Physical Anesthesia Plan  ASA: III  Anesthesia Plan: Spinal   Post-op Pain Management:    Induction:   Airway Management Planned:   Additional Equipment:   Intra-op Plan:   Post-operative Plan:   Informed Consent: I have reviewed the patients History and Physical, chart, labs and discussed the procedure including the risks, benefits and alternatives for the proposed anesthesia with the patient or authorized representative who has indicated his/her understanding and acceptance.     Plan Discussed with:   Anesthesia Plan Comments:         Anesthesia Quick Evaluation

## 2010-12-01 LAB — CBC
HCT: 31.4 % — ABNORMAL LOW (ref 36.0–46.0)
Hemoglobin: 10.5 g/dL — ABNORMAL LOW (ref 12.0–15.0)
MCH: 30.8 pg (ref 26.0–34.0)
MCHC: 33.4 g/dL (ref 30.0–36.0)
MCV: 92.1 fL (ref 78.0–100.0)
RDW: 13.6 % (ref 11.5–15.5)

## 2010-12-01 NOTE — Plan of Care (Signed)
Problem: Discharge Progression Outcomes Goal: Barriers To Progression Addressed/Resolved Outcome: Completed/Met Date Met:  12/01/10 Magnesium gtt d/c'd on 11/24 @ 0650-VSS-no PIH S&S reported by pt.

## 2010-12-01 NOTE — Progress Notes (Signed)
PSYCHOSOCIAL ASSESSMENT ~ MATERNAL/CHILD Name:  Kathryn Valentine Age 25 Referral Date  12/01/10 Reason/Source  NICU Admission  I. FAMILY/HOME ENVIRONMENT A. Child's Legal Guardian x Parent(s)  Kathryn Valentine parent    DSS  Name  Kathryn Valentine DOB  December 23, 1985 Age  60   [phone] [place] [address]   Name  Kathryn Valentine DOB Age  Address Same as above Other Household Members/Support Persons Name  Kathryn Valentine  Relationship  Dtr DOB        Name                   Relationship                   DOB        Name                   Relationship                   DOB                   Name                   Relationship                   DOB C.   Other Support   II. PSYCHOSOCIAL DATA A. Information Source x Patient Interview   Family Interview           Other B. Event organiser  Employment    [employer]   Applying for  [employer]                                  Self Pay   Food Stamps      WIC Yes  Work Scientist, physiological Housing      Section 8     Maternity Care Coordination/Child Service Coordination/Early Intervention    School  Grade      Other Cultural and Environment Information Cultural Issues Impacting Care  III. STRENGTHS  Supportive family/friends Yes  Adequate Resources  Yes Compliance with medical plan  Yes  Home prepared for Child (including basic supplies) Yes Understanding of illness         Yes  Other  IV. RISK FACTORS AND CURRENT PROBLEMS      No Problems Note   Substance Abuse                                             Pt Family             Mental Illness     Pt Family               Family/Relationship Issues   Pt Family      Abuse/Neglect/Domestic Violence   Pt Family   Financial Resources     Pt Family  Transportation     Pt Family  DSS Involvement    Pt Family  Adjustment to Illness    Pt Family   Knowledge/Cognitive Deficit   Pt Family   Compliance with  Treatment   Pt Family  Basic Needs (food, housing, etc)  Pt Family  Housing Concerns    Pt Family  Other             V. SOCIAL WORK ASSESSMENT CSW met with pt, provided NICU brochure to help pt understand SW role in NICU.  Pt reports being understanding of illness and is stable emotionally at this time.  Pt reports FOB is in [employer] and could possibly be deployed soon, however, pt expressed having dtr and sister to help support while FOB is away.  Educated pt on SSI and will begin process of applying for pt.  Pt currently does not have any insurance, provided pt with Medicaid application.  Pt reports already having WIC.  Pt denied/exressed any concerns with supplies or resources at this time.  Will continue to follow infant while in NICU.  VI. SOCIAL WORK PLAN (In [place]) No Further Intervention Required/No Barriers to Discharge Psychosocial Support and Ongoing Assessment of Needs Patient/Family  Education Child Protective Services Report  [place]        Date Information/Referral to [facility]   Other

## 2010-12-01 NOTE — Progress Notes (Signed)
Pt transferred to room 309. Stable upon transfer and pt without complaints. Settled into room and then pt ambulated to visit baby in the NICU.  Tia Alert A

## 2010-12-01 NOTE — Progress Notes (Signed)
Patient ID: Kathryn Valentine, female   DOB: 15-Oct-1985, 25 y.o.   MRN: [id number] Postop day 1 Vital  signs normal The bowel sounds Lochia moderate EDC magnesium sulfate and transferred today

## 2010-12-02 NOTE — Progress Notes (Signed)
Patient ID: Kathryn Valentine, female   DOB: 25-Jun-1985, 25 y.o.   MRN: [id number] Postpartum day 2 Blood pressure is normal Fundus firm Lochia moderate Legs negative No complaints

## 2010-12-03 ENCOUNTER — Encounter (HOSPITAL_COMMUNITY): Payer: Self-pay | Admitting: *Deleted

## 2010-12-03 NOTE — Discharge Summary (Signed)
Obstetric Discharge Summary Reason for Admission: observation/evaluation Prenatal Procedures: Preeclampsia Intrapartum Procedures: cesarean: low cervical, transverse Postpartum Procedures: none Complications-Operative and Postpartum: none Hemoglobin  Date Value Range Status  12/01/2010 10.5* 12.0-15.0 (g/dL) Final     HCT  Date Value Range Status  12/01/2010 31.4* 36.0-46.0 (%) Final    Discharge Diagnoses: Preelampsia  Discharge Information: Date: 12/03/2010 Activity: pelvic rest Diet: routine Medications: Percocet Condition: stable Instructions: refer to practice specific booklet Discharge to: home Follow-up Information    Follow up with MARSHALL,BERNARD A, MD. Call in 6 weeks.   Contact information:   [address] [place] [address] [phone]          Newborn Data: Live born female  Birth Weight:  APGAR: 7, 8  Home with mother.  MARSHALL,BERNARD A 12/03/2010, 7:34 AM

## 2013-10-22 LAB — OB RESULTS CONSOLE ANTIBODY SCREEN: Antibody Screen: NEGATIVE

## 2013-10-22 LAB — OB RESULTS CONSOLE HGB/HCT, BLOOD
HCT: 40 %
Hemoglobin: 13.5 g/dL

## 2013-10-22 LAB — OB RESULTS CONSOLE HEPATITIS B SURFACE ANTIGEN: Hepatitis B Surface Ag: NEGATIVE

## 2013-10-22 LAB — OB RESULTS CONSOLE HIV ANTIBODY (ROUTINE TESTING): HIV: NONREACTIVE

## 2013-10-22 LAB — OB RESULTS CONSOLE RUBELLA ANTIBODY, IGM: RUBELLA: IMMUNE

## 2013-10-22 LAB — OB RESULTS CONSOLE RPR: RPR: NONREACTIVE

## 2013-10-22 LAB — OB RESULTS CONSOLE ABO/RH: RH Type: POSITIVE

## 2013-10-22 LAB — OB RESULTS CONSOLE PLATELET COUNT: Platelets: 303 10*3/uL

## 2013-10-22 LAB — OB RESULTS CONSOLE GC/CHLAMYDIA
CHLAMYDIA, DNA PROBE: NEGATIVE
GC PROBE AMP, GENITAL: NEGATIVE

## 2013-10-25 LAB — CYTOLOGY - PAP: PAP SMEAR: NEGATIVE

## 2013-10-25 LAB — PROCEDURE REPORT - SCANNED: PAP SMEAR: NEGATIVE

## 2013-11-08 ENCOUNTER — Encounter (HOSPITAL_COMMUNITY): Payer: Self-pay | Admitting: *Deleted

## 2014-02-23 LAB — GLUCOSE TOLERANCE, 1 HOUR (50G) W/O FASTING: Glucose, GTT - 1 Hour: 74 mg/dL (ref ?–200)

## 2014-04-25 ENCOUNTER — Encounter (HOSPITAL_COMMUNITY): Payer: Self-pay | Admitting: *Deleted

## 2014-04-25 ENCOUNTER — Inpatient Hospital Stay (HOSPITAL_COMMUNITY)
Admission: AD | Admit: 2014-04-25 | Discharge: 2014-04-25 | Disposition: A | Discharge status: Home / Self Care | Payer: BLUE CROSS/BLUE SHIELD | Source: Ambulatory Visit | Attending: Obstetrics and Gynecology | Admitting: Obstetrics and Gynecology

## 2014-04-25 DIAGNOSIS — Z3A33 33 weeks gestation of pregnancy: Secondary | ICD-10-CM | POA: Diagnosis not present

## 2014-04-25 DIAGNOSIS — R51 Headache: Secondary | ICD-10-CM | POA: Diagnosis not present

## 2014-04-25 DIAGNOSIS — R42 Dizziness and giddiness: Secondary | ICD-10-CM | POA: Insufficient documentation

## 2014-04-25 DIAGNOSIS — O9989 Other specified diseases and conditions complicating pregnancy, childbirth and the puerperium: Secondary | ICD-10-CM | POA: Insufficient documentation

## 2014-04-25 HISTORY — DX: Headache: R51

## 2014-04-25 HISTORY — DX: Headache, unspecified: R51.9

## 2014-04-25 LAB — COMPREHENSIVE METABOLIC PANEL
ALK PHOS: 118 U/L — AB (ref 39–117)
ALT: 14 U/L (ref 0–35)
AST: 24 U/L (ref 0–37)
Albumin: 2.7 g/dL — ABNORMAL LOW (ref 3.5–5.2)
Anion gap: 7 (ref 5–15)
BUN: 7 mg/dL (ref 6–23)
CALCIUM: 8.2 mg/dL — AB (ref 8.4–10.5)
CO2: 21 mmol/L (ref 19–32)
Chloride: 107 mmol/L (ref 96–112)
Creatinine, Ser: 0.56 mg/dL (ref 0.50–1.10)
GLUCOSE: 86 mg/dL (ref 70–99)
POTASSIUM: 3.6 mmol/L (ref 3.5–5.1)
SODIUM: 135 mmol/L (ref 135–145)
TOTAL PROTEIN: 6.8 g/dL (ref 6.0–8.3)
Total Bilirubin: 0.6 mg/dL (ref 0.3–1.2)

## 2014-04-25 LAB — URINE MICROSCOPIC-ADD ON

## 2014-04-25 LAB — URINALYSIS, ROUTINE W REFLEX MICROSCOPIC
BILIRUBIN URINE: NEGATIVE
Glucose, UA: NEGATIVE mg/dL
Ketones, ur: NEGATIVE mg/dL
Leukocytes, UA: NEGATIVE
NITRITE: NEGATIVE
PROTEIN: NEGATIVE mg/dL
SPECIFIC GRAVITY, URINE: 1.015 (ref 1.005–1.030)
UROBILINOGEN UA: 0.2 mg/dL (ref 0.0–1.0)
pH: 7 (ref 5.0–8.0)

## 2014-04-25 LAB — CBC
HEMATOCRIT: 35.3 % — AB (ref 36.0–46.0)
HEMOGLOBIN: 11.9 g/dL — AB (ref 12.0–15.0)
MCH: 30.4 pg (ref 26.0–34.0)
MCHC: 33.7 g/dL (ref 30.0–36.0)
MCV: 90.3 fL (ref 78.0–100.0)
Platelets: 168 10*3/uL (ref 150–400)
RBC: 3.91 MIL/uL (ref 3.87–5.11)
RDW: 12.3 % (ref 11.5–15.5)
WBC: 5.7 10*3/uL (ref 4.0–10.5)

## 2014-04-25 LAB — PROTEIN / CREATININE RATIO, URINE
Creatinine, Urine: 156 mg/dL
Protein Creatinine Ratio: 0.08 (ref 0.00–0.15)
Total Protein, Urine: 13 mg/dL

## 2014-04-25 NOTE — MAU Note (Signed)
[facility], has first appointment on Thursday.

## 2014-04-25 NOTE — Discharge Instructions (Signed)
[facility] were seen at [facility] for evaluation of dizziness. You were concerned about the possibility of preeclampsia given your history. Your blood pressure was normal while in the MAU. Your lab work was also normal. This was reassuring. Please follow up with your prenatal provider on Thursday as scheduled. Please seek care sooner if you develop severe headache, visual changes or other concerning symptoms. Please seek care sooner if you develop contractions, loss of fluid, vaginal bleeding or decreased fetal movement.

## 2014-04-25 NOTE — MAU Note (Signed)
Pt had "dizzy spell" last night around 2230, was having dinner, thought she was going to pass out, lasted about 10 minutes.  Did not lose consciousness.  Concerned because of hx of preeclampsia with previous pregnancies.  Has had some lower abd pain over the weekend - is more dull today.  Is not feeling very well today - had dull HA last night, some spots in vision.  Denies bleeding or LOF.

## 2014-04-25 NOTE — MAU Provider Note (Signed)
History     CSN: [id number]  Arrival date and time: 04/25/14 [id number]   First Provider Initiated Contact with Patient 04/25/14 1127      Chief Complaint  Patient presents with  . Dizziness   HPI   Kathryn Valentine is a 29 y.o. [id number] with a history of pre-eclampsia in her 2 previous pregnancies who presents to MAU today with dizziness and spots in her vision. Her symptoms started last night while she was eating dinner at home and lasted around 2 hours. Pt reported she felt dizzy while sitting down and could not keep her eyes open. She thought she was going to pass out, but did not lose consciousness. She also had a mild headache. She reports feeling mild pressure on the back of her head. After 2 hours she felt better, stood up, and went to bed.Today she reports she is still feeling dizzy, but not as much as yesterday. She is also having vision spots that have been coming and going. Her headache has also improved.  During interview pt also endorsed dull lower abdominal pain that started last Friday. Pain is a 2/10. Resting in bed seems to relieve the pain and walking makes it worse.  Pt has been monitoring her blood pressure at home at least once daily and reports the highest one has been 121/65.   Pt reports good fetal movement, no leakage of fluid or vaginal bleeding. Pt denies contractions, blurry vision, RUQ pain and peripheral edema.     OB History    Gravida Para Term Preterm AB TAB SAB Ectopic Multiple Living   4 2 0 2 1 0 1 0 0 2       Past Medical History  Diagnosis Date  . PONV (postoperative nausea and vomiting)   . Cesarean delivery delivered 10/09/2009    breech presentation  . Pregnancy induced hypertension   . Preterm labor   . Headache     Past Surgical History  Procedure Laterality Date  . Cesarean section    . Cesarean section  11/30/2010    Procedure: CESAREAN SECTION;  Surgeon: Kathreen CosierBernard A Marshall, MD;  Location: [facility];  Service: Gynecology;  Laterality:  N/A;    Family History  Problem Relation Age of Onset  . Diabetes Maternal Grandmother   . Diabetes Maternal Grandfather   . Diabetes Paternal Grandmother   . Diabetes Paternal Grandfather     History  Substance Use Topics  . Smoking status: Never Smoker   . Smokeless tobacco: Never Used  . Alcohol Use: No    Allergies: No Known Allergies  Prescriptions prior to admission  Medication Sig Dispense Refill Last Dose  . ACETAMINOPHEN-CAFF-BUTALBITAL 50-500-40 MG CAPS Take 1 tablet by mouth every 6 (six) hours as needed (migraines).   04/24/2014 at Unknown time  . calcium carbonate (TUMS - DOSED IN MG ELEMENTAL CALCIUM) 500 MG chewable tablet Chew 1-2 tablets by mouth daily as needed for indigestion or heartburn. Depends on indigestion if takes 1 or 2 tablets   04/24/2014 at Unknown time  . Prenatal Vit-Min-FA-Fish Oil ([employer] PRENATAL GUMMY PO) Take 2 tablets by mouth daily.   04/24/2014 at Unknown time    ROS  Respiratory: no cough, shortness of breath, or wheezing Cardiovascular: no chest pain or dyspnea on exertion Gastrointestinal : no abdominal pain, change in bowel habits Genito-Urinary: no dysuria, trouble voiding, or hematuria Neurological: mild headache   Physical Exam   Blood pressure 121/67, pulse 84, temperature 97.7 F (36.5 C), temperature source Oral,  resp. rate 16, unknown if currently breastfeeding.  Physical Exam General appearance: alert and cooperative, in no distress CV: regular rate and rhythm Lungs: CTAB, no increased work of breathing  Abdomen: soft, non tender, + bowel sounds  Extremities: no edema     MAU Course  Procedures  MDM NST reactive Toco quiet  CBC    Component Value Date/Time   WBC 5.7 04/25/2014 1129   RBC 3.91 04/25/2014 1129   HGB 11.9* 04/25/2014 1129   HGB 13.5 10/22/2013   HCT 35.3* 04/25/2014 1129   HCT 40 10/22/2013   PLT 168 04/25/2014 1129   PLT 303 10/22/2013   MCV 90.3 04/25/2014 1129   MCH 30.4 04/25/2014 1129    MCHC 33.7 04/25/2014 1129   RDW 12.3 04/25/2014 1129    CMP     Component Value Date/Time   NA 135 04/25/2014 1129   K 3.6 04/25/2014 1129   CL 107 04/25/2014 1129   CO2 21 04/25/2014 1129   GLUCOSE 86 04/25/2014 1129   BUN 7 04/25/2014 1129   CREATININE 0.56 04/25/2014 1129   CREATININE 0.87 11/28/2010 1750   CALCIUM 8.2* 04/25/2014 1129   PROT 6.8 04/25/2014 1129   ALBUMIN 2.7* 04/25/2014 1129   AST 24 04/25/2014 1129   ALT 14 04/25/2014 1129   ALKPHOS 118* 04/25/2014 1129   BILITOT 0.6 04/25/2014 1129   GFRNONAA >90 04/25/2014 1129   GFRAA >90 04/25/2014 1129    UPC 0.08  Assessment and Plan  Kathryn Valentine is a 29 y.o. [id number] with a history of pre-eclampsia with 2 previous pregnancies who presents to MAU today with dizziness, spots in her vision and a mild headache. Symptoms had complete resolved prior to discharge without intervention.   #Rule out Pre-Eclampsia Blood pressure normal range during observation in MAU. HELLP labs negative. UPC 0.08. Symptoms have been intermittent and resolved prior to discharge. States she is anxious about any abnormal symptom given her history.  - Reassurance provided.  - Preeclampsia precautions reviewed.   #Dizziness, resolved Possibly secondary to dehydration.  - Recommend plenty of fluids.   Follow up in clinic on Thursday as scheduled for new OB visit with [facility]. Previously scheduled.   William Dalton MD  04/25/2014, 1:52 PM

## 2014-04-27 ENCOUNTER — Encounter: Payer: Self-pay | Admitting: *Deleted

## 2014-04-28 ENCOUNTER — Encounter: Payer: Self-pay | Admitting: Obstetrics and Gynecology

## 2014-04-28 ENCOUNTER — Ambulatory Visit (INDEPENDENT_AMBULATORY_CARE_PROVIDER_SITE_OTHER): Payer: BLUE CROSS/BLUE SHIELD | Admitting: Obstetrics and Gynecology

## 2014-04-28 VITALS — BP 105/59 | HR 79 | Wt 197.6 lb

## 2014-04-28 DIAGNOSIS — Z8742 Personal history of other diseases of the female genital tract: Secondary | ICD-10-CM

## 2014-04-28 DIAGNOSIS — O3421 Maternal care for scar from previous cesarean delivery: Secondary | ICD-10-CM

## 2014-04-28 DIAGNOSIS — O09293 Supervision of pregnancy with other poor reproductive or obstetric history, third trimester: Secondary | ICD-10-CM

## 2014-04-28 DIAGNOSIS — Z8759 Personal history of other complications of pregnancy, childbirth and the puerperium: Secondary | ICD-10-CM | POA: Insufficient documentation

## 2014-04-28 DIAGNOSIS — O09893 Supervision of other high risk pregnancies, third trimester: Secondary | ICD-10-CM | POA: Insufficient documentation

## 2014-04-28 DIAGNOSIS — O09213 Supervision of pregnancy with history of pre-term labor, third trimester: Secondary | ICD-10-CM | POA: Insufficient documentation

## 2014-04-28 DIAGNOSIS — O34219 Maternal care for unspecified type scar from previous cesarean delivery: Secondary | ICD-10-CM | POA: Insufficient documentation

## 2014-04-28 LAB — POCT URINALYSIS DIP (DEVICE)
BILIRUBIN URINE: NEGATIVE
Glucose, UA: NEGATIVE mg/dL
Hgb urine dipstick: NEGATIVE
Ketones, ur: NEGATIVE mg/dL
NITRITE: NEGATIVE
PH: 7.5 (ref 5.0–8.0)
PROTEIN: NEGATIVE mg/dL
Specific Gravity, Urine: 1.015 (ref 1.005–1.030)
Urobilinogen, UA: 0.2 mg/dL (ref 0.0–1.0)

## 2014-04-28 NOTE — Progress Notes (Signed)
Leukocytes: Trace 

## 2014-04-28 NOTE — Progress Notes (Signed)
Transfer from Dr. Gaynell FaceMarshall due to dissatisfaction with phone call response. She will call office to inform them.  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 Fatigued; dizzy intermittently, occ mild H/A.Marland Kitchen. Seen in MAU 2 days ago for same and CBC, CMP normal then.  Advised slow position changes, avoid supine hypotension.  Hx SGA x2 (with preE x2); size sl less than dates today> will schedule US for interval growth

## 2014-04-28 NOTE — Progress Notes (Signed)
Transfer from Dr. Gaynell FaceMarshall last seen 2 weeks ago. Declines flu shot, reports that she already had tdap through school.

## 2014-05-04 ENCOUNTER — Other Ambulatory Visit: Payer: Self-pay | Admitting: Obstetrics and Gynecology

## 2014-05-04 ENCOUNTER — Ambulatory Visit (HOSPITAL_COMMUNITY)
Admission: RE | Admit: 2014-05-04 | Discharge: 2014-05-04 | Disposition: A | Discharge status: Home / Self Care | Payer: BLUE CROSS/BLUE SHIELD | Source: Ambulatory Visit | Attending: Obstetrics and Gynecology | Admitting: Obstetrics and Gynecology

## 2014-05-04 DIAGNOSIS — O09893 Supervision of other high risk pregnancies, third trimester: Secondary | ICD-10-CM

## 2014-05-04 DIAGNOSIS — Z36 Encounter for antenatal screening of mother: Secondary | ICD-10-CM | POA: Diagnosis present

## 2014-05-04 DIAGNOSIS — O3421 Maternal care for scar from previous cesarean delivery: Secondary | ICD-10-CM | POA: Diagnosis not present

## 2014-05-04 DIAGNOSIS — Z8759 Personal history of other complications of pregnancy, childbirth and the puerperium: Secondary | ICD-10-CM

## 2014-05-04 DIAGNOSIS — O365930 Maternal care for other known or suspected poor fetal growth, third trimester, not applicable or unspecified: Secondary | ICD-10-CM | POA: Diagnosis not present

## 2014-05-04 DIAGNOSIS — O09213 Supervision of pregnancy with history of pre-term labor, third trimester: Secondary | ICD-10-CM | POA: Diagnosis not present

## 2014-05-04 DIAGNOSIS — Z3A34 34 weeks gestation of pregnancy: Secondary | ICD-10-CM | POA: Insufficient documentation

## 2014-05-04 DIAGNOSIS — O34219 Maternal care for unspecified type scar from previous cesarean delivery: Secondary | ICD-10-CM

## 2014-05-12 ENCOUNTER — Ambulatory Visit (INDEPENDENT_AMBULATORY_CARE_PROVIDER_SITE_OTHER): Payer: BLUE CROSS/BLUE SHIELD | Admitting: Obstetrics & Gynecology

## 2014-05-12 VITALS — BP 117/65 | HR 79 | Wt 199.3 lb

## 2014-05-12 DIAGNOSIS — O09293 Supervision of pregnancy with other poor reproductive or obstetric history, third trimester: Secondary | ICD-10-CM

## 2014-05-12 DIAGNOSIS — O34219 Maternal care for unspecified type scar from previous cesarean delivery: Secondary | ICD-10-CM

## 2014-05-12 DIAGNOSIS — O09893 Supervision of other high risk pregnancies, third trimester: Secondary | ICD-10-CM

## 2014-05-12 DIAGNOSIS — O3421 Maternal care for scar from previous cesarean delivery: Secondary | ICD-10-CM

## 2014-05-12 LAB — POCT URINALYSIS DIP (DEVICE)
BILIRUBIN URINE: NEGATIVE
GLUCOSE, UA: NEGATIVE mg/dL
HGB URINE DIPSTICK: NEGATIVE
Ketones, ur: NEGATIVE mg/dL
NITRITE: NEGATIVE
Protein, ur: NEGATIVE mg/dL
Specific Gravity, Urine: 1.025 (ref 1.005–1.030)
UROBILINOGEN UA: 0.2 mg/dL (ref 0.0–1.0)
pH: 7 (ref 5.0–8.0)

## 2014-05-12 NOTE — Patient Instructions (Signed)

## 2014-05-12 NOTE — Progress Notes (Signed)
GBS GC CT today, US reviewed 35 %ile growth, some pressure noted. Schedule repeat CS and BTL 39 weeks 06/03/14

## 2014-05-12 NOTE — Progress Notes (Signed)
Leukocytes: Moderate

## 2014-05-13 LAB — GC/CHLAMYDIA PROBE AMP
CT PROBE, AMP APTIMA: NEGATIVE
GC Probe RNA: NEGATIVE

## 2014-05-13 LAB — CULTURE, BETA STREP (GROUP B ONLY)

## 2014-05-15 ENCOUNTER — Encounter: Payer: Self-pay | Admitting: Family Medicine

## 2014-05-15 DIAGNOSIS — Z302 Encounter for sterilization: Secondary | ICD-10-CM

## 2014-05-15 NOTE — H&P (Signed)
  Kathryn EdwardsMikafui Antonio-Obese is an 29 y.o. [id number] 9231w2d female.   Chief Complaint: Here for repeat C-section HPI: Previous Cesarean Delivery x 2. For Elective repeat.  Past Medical History  Diagnosis Date  . PONV (postoperative nausea and vomiting)   . Cesarean delivery delivered 10/09/2009    breech presentation  . Pregnancy induced hypertension   . Preterm labor   . Headache     Past Surgical History  Procedure Laterality Date  . Cesarean section    . Cesarean section  11/30/2010    Procedure: CESAREAN SECTION;  Surgeon: Kathreen CosierBernard A Marshall, MD;  Location: [facility];  Service: Gynecology;  Laterality: N/A;    Family History  Problem Relation Age of Onset  . Diabetes Maternal Grandmother   . Diabetes Maternal Grandfather   . Diabetes Paternal Grandmother   . Diabetes Paternal Grandfather    Social History:  reports that she has never smoked. She has never used smokeless tobacco. She reports that she does not drink alcohol or use illicit drugs.  Allergies: No Known Allergies  No current facility-administered medications on file prior to encounter.   Current Outpatient Prescriptions on File Prior to Encounter  Medication Sig Dispense Refill  . ACETAMINOPHEN-CAFF-BUTALBITAL 50-500-40 MG CAPS Take 1 tablet by mouth every 6 (six) hours as needed (migraines).    . Butalbital-APAP-Caffeine (FIORICET PO) Take by mouth.    . calcium carbonate (TUMS - DOSED IN MG ELEMENTAL CALCIUM) 500 MG chewable tablet Chew 1-2 tablets by mouth daily as needed for indigestion or heartburn. Depends on indigestion if takes 1 or 2 tablets    . Prenatal Vit-Min-FA-Fish Oil ([employer] PRENATAL GUMMY PO) Take 2 tablets by mouth daily.      A comprehensive review of systems was negative.  unknown if currently breastfeeding. There were no vitals taken for this visit. General appearance: alert, cooperative and appears stated age Head: Normocephalic, without obvious abnormality, atraumatic Neck: supple, symmetrical,  trachea midline Lungs: normal effort Heart: regular rate and rhythm Abdomen: gravid. NT Extremities: extremities normal, atraumatic, no cyanosis or edema Skin: Skin color, texture, turgor normal. No rashes or lesions Neurologic: Grossly normal   Lab Results  Component Value Date   WBC 5.7 04/25/2014   HGB 11.9* 04/25/2014   HCT 35.3* 04/25/2014   MCV 90.3 04/25/2014   PLT 168 04/25/2014     Assessment/Plan Patient Active Problem List   Diagnosis Date Noted  . Encounter for sterilization 05/15/2014  . Previous cesarean section complicating pregnancy 04/28/2014  . Previous pregnancy complicated by pregnancy-induced hypertension in third trimester, antepartum 04/28/2014  . Previous preterm delivery in third trimester, antepartum 04/28/2014  . History of prior pregnancy with SGA newborn 04/28/2014   RLTCS and BTL. Risks include but are not limited to bleeding, infection, injury to surrounding structures, including bowel, bladder and ureters, blood clots, and death.  Likelihood of success is high. Patient counseled, r.e. Risks benefits of BTL, including permanency of procedure, risk of failure(1:100), increased risk of ectopic.  Patient verbalized understanding and desires to proceed.  Fidencio Duddy S 05/15/2014, 11:56 AM

## 2014-05-19 ENCOUNTER — Telehealth: Payer: Self-pay

## 2014-05-19 ENCOUNTER — Ambulatory Visit (INDEPENDENT_AMBULATORY_CARE_PROVIDER_SITE_OTHER): Payer: BLUE CROSS/BLUE SHIELD | Admitting: Family

## 2014-05-19 VITALS — BP 108/66 | HR 102 | Temp 98.6°F | Wt 196.7 lb

## 2014-05-19 DIAGNOSIS — O09893 Supervision of other high risk pregnancies, third trimester: Secondary | ICD-10-CM

## 2014-05-19 DIAGNOSIS — O09293 Supervision of pregnancy with other poor reproductive or obstetric history, third trimester: Secondary | ICD-10-CM

## 2014-05-19 LAB — POCT URINALYSIS DIP (DEVICE)
Bilirubin Urine: NEGATIVE
GLUCOSE, UA: NEGATIVE mg/dL
HGB URINE DIPSTICK: NEGATIVE
Ketones, ur: NEGATIVE mg/dL
NITRITE: NEGATIVE
PH: 7 (ref 5.0–8.0)
Protein, ur: NEGATIVE mg/dL
Specific Gravity, Urine: 1.015 (ref 1.005–1.030)
UROBILINOGEN UA: 0.2 mg/dL (ref 0.0–1.0)

## 2014-05-19 NOTE — Progress Notes (Signed)
Doing well; no questions or concerns.  Mod leuks in urine.  Denies UTI symptoms.  Urine culture sent.  Reviewed GBS results - pos.

## 2014-05-19 NOTE — Telephone Encounter (Signed)
-----   Message from Willodean Rosenthalarolyn Harraway-Smith, MD sent at 05/18/2014  3:14 PM EDT ----- Please call pt.  Begin baby ASA.  Thx, clh-S

## 2014-05-19 NOTE — Progress Notes (Signed)
C/o occasional pelvic pressure.  

## 2014-05-19 NOTE — Telephone Encounter (Signed)
Called patient and informed her of recommendations. Patient verbalized understanding. No questions or concerns.

## 2014-05-21 LAB — CULTURE, OB URINE

## 2014-05-26 ENCOUNTER — Other Ambulatory Visit: Payer: Self-pay | Admitting: Family

## 2014-05-26 DIAGNOSIS — B951 Streptococcus, group B, as the cause of diseases classified elsewhere: Secondary | ICD-10-CM | POA: Insufficient documentation

## 2014-05-26 DIAGNOSIS — O2343 Unspecified infection of urinary tract in pregnancy, third trimester: Principal | ICD-10-CM

## 2014-05-26 DIAGNOSIS — O2340 Unspecified infection of urinary tract in pregnancy, unspecified trimester: Secondary | ICD-10-CM

## 2014-05-26 MED ORDER — AMOXICILLIN 500 MG PO CAPS: 500.0000 mg | ORAL_CAPSULE | Freq: Three times a day (TID) | ORAL | Status: DC

## 2014-05-26 NOTE — Progress Notes (Unsigned)
Pt with GBS in urine.  RX sent to pharmacy for Amoxicillin 500 mg TID x 7 days.  Pt will be treated for GBS in labor.

## 2014-05-27 ENCOUNTER — Encounter: Payer: BLUE CROSS/BLUE SHIELD | Admitting: Obstetrics and Gynecology

## 2014-05-30 ENCOUNTER — Ambulatory Visit (INDEPENDENT_AMBULATORY_CARE_PROVIDER_SITE_OTHER): Payer: BLUE CROSS/BLUE SHIELD | Admitting: Obstetrics and Gynecology

## 2014-05-30 VITALS — BP 115/72 | HR 96 | Temp 97.7°F | Wt 200.2 lb

## 2014-05-30 DIAGNOSIS — O34219 Maternal care for unspecified type scar from previous cesarean delivery: Secondary | ICD-10-CM

## 2014-05-30 DIAGNOSIS — O09213 Supervision of pregnancy with history of pre-term labor, third trimester: Secondary | ICD-10-CM

## 2014-05-30 DIAGNOSIS — O3421 Maternal care for scar from previous cesarean delivery: Secondary | ICD-10-CM

## 2014-05-30 DIAGNOSIS — O09893 Supervision of other high risk pregnancies, third trimester: Secondary | ICD-10-CM

## 2014-05-30 DIAGNOSIS — O09293 Supervision of pregnancy with other poor reproductive or obstetric history, third trimester: Secondary | ICD-10-CM

## 2014-05-30 LAB — POCT URINALYSIS DIP (DEVICE)
GLUCOSE, UA: NEGATIVE mg/dL
HGB URINE DIPSTICK: NEGATIVE
Nitrite: NEGATIVE
Protein, ur: 100 mg/dL — AB
SPECIFIC GRAVITY, URINE: 1.02 (ref 1.005–1.030)
Urobilinogen, UA: 0.2 mg/dL (ref 0.0–1.0)
pH: 7 (ref 5.0–8.0)

## 2014-05-30 NOTE — Progress Notes (Signed)
29 y.o. [id number] at 6562w3d here for routine OB visit. Doing well today.  1. History of PreEclampsia with prior pregnancies x 2. Blood pressure well controlled today. Continues to aspirin 81 mg daily.  2. History of C-section. Repeat C-section with BTL scheduled for 8496w0d.  3. Routine PNC. Labor precautions reviewed. SVE today unchanged from prior.

## 2014-05-30 NOTE — Progress Notes (Signed)
Large leuks on UA, recently picked up Rx for UTI

## 2014-06-01 ENCOUNTER — Telehealth: Payer: Self-pay | Admitting: *Deleted

## 2014-06-01 NOTE — Telephone Encounter (Signed)
Called pt and informed her of +GBS in her urine requiring antibiotic treatment. Pt states that she saw the Rx on her MyChart account and picked it up from the pharmacy on 5/21. She is scheduled for C/S on 5/27 and had no questions.

## 2014-06-02 ENCOUNTER — Encounter (HOSPITAL_COMMUNITY): Payer: Self-pay

## 2014-06-02 ENCOUNTER — Encounter (HOSPITAL_COMMUNITY)
Admission: RE | Admit: 2014-06-02 | Discharge: 2014-06-02 | Disposition: A | Discharge status: Home / Self Care | Payer: BLUE CROSS/BLUE SHIELD | Source: Ambulatory Visit | Attending: Family Medicine | Admitting: Family Medicine

## 2014-06-02 VITALS — BP 104/70 | HR 90 | Resp 20 | Ht 67.0 in | Wt 200.1 lb

## 2014-06-02 DIAGNOSIS — O2343 Unspecified infection of urinary tract in pregnancy, third trimester: Principal | ICD-10-CM

## 2014-06-02 DIAGNOSIS — Z302 Encounter for sterilization: Secondary | ICD-10-CM

## 2014-06-02 DIAGNOSIS — B951 Streptococcus, group B, as the cause of diseases classified elsewhere: Secondary | ICD-10-CM

## 2014-06-02 HISTORY — DX: Other specified pregnancy related conditions, unspecified trimester: O26.899

## 2014-06-02 HISTORY — DX: Heartburn: R12

## 2014-06-02 LAB — CBC
HEMATOCRIT: 35.5 % — AB (ref 36.0–46.0)
HEMOGLOBIN: 12.1 g/dL (ref 12.0–15.0)
MCH: 30.5 pg (ref 26.0–34.0)
MCHC: 34.1 g/dL (ref 30.0–36.0)
MCV: 89.4 fL (ref 78.0–100.0)
Platelets: 171 10*3/uL (ref 150–400)
RBC: 3.97 MIL/uL (ref 3.87–5.11)
RDW: 12.5 % (ref 11.5–15.5)
WBC: 5.8 10*3/uL (ref 4.0–10.5)

## 2014-06-02 LAB — TYPE AND SCREEN
ABO/RH(D): B POS
Antibody Screen: NEGATIVE

## 2014-06-02 NOTE — Patient Instructions (Addendum)
   Your procedure is scheduled on:  Friday, May 27  Enter through the Main Entrance of [facility] at: 12:45 PM Pick up the phone at the desk and dial [phone] and inform us of your arrival.  Please call this number if you have any problems the morning of surgery: [phone]  Remember: Do not eat food after midnight: Thursday Do not drink clear liquids after: 10 AM Friday, day of surgery Take these medicines the morning of surgery with a SIP OF WATER:  NONE  Do not wear jewelry, make-up, or FINGER nail polish No metal in your hair or on your body. Do not wear lotions, powders, perfumes.  You may wear deodorant.  Do not bring valuables to the hospital.   Leave suitcase in the car. After Surgery it may be brought to your room. For patients being admitted to the hospital, checkout time is 11:00am the day of discharge.  Home with friend Kathryn HaringSnow cell [phone].

## 2014-06-02 NOTE — Anesthesia Preprocedure Evaluation (Addendum)
Anesthesia Evaluation  Patient identified by MRN, date of birth, ID band Patient awake    Reviewed: Allergy & Precautions, NPO status , Patient's Chart, lab work & pertinent test results  History of Anesthesia Complications Negative for: history of anesthetic complications  Airway Mallampati: II  TM Distance: >3 FB Neck ROM: Full    Dental no notable dental hx. (+) Dental Advisory Given   Pulmonary neg pulmonary ROS,  breath sounds clear to auscultation  Pulmonary exam normal       Cardiovascular hypertension, negative cardio ROS Normal cardiovascular examRhythm:Regular Rate:Normal     Neuro/Psych  Headaches, negative psych ROS   GI/Hepatic Neg liver ROS, GERD-  Medicated and Controlled,  Endo/Other  obesity  Renal/GU negative Renal ROS  negative genitourinary   Musculoskeletal negative musculoskeletal ROS (+)   Abdominal   Peds negative pediatric ROS (+)  Hematology negative hematology ROS (+)   Anesthesia Other Findings   Reproductive/Obstetrics (+) Pregnancy                            Anesthesia Physical Anesthesia Plan  ASA: II  Anesthesia Plan: Spinal   Post-op Pain Management:    Induction:   Airway Management Planned:   Additional Equipment:   Intra-op Plan:   Post-operative Plan:   Informed Consent: I have reviewed the patients History and Physical, chart, labs and discussed the procedure including the risks, benefits and alternatives for the proposed anesthesia with the patient or authorized representative who has indicated his/her understanding and acceptance.   Dental advisory given  Plan Discussed with: CRNA  Anesthesia Plan Comments:         Anesthesia Quick Evaluation

## 2014-06-03 ENCOUNTER — Inpatient Hospital Stay (HOSPITAL_COMMUNITY): Payer: BLUE CROSS/BLUE SHIELD | Admitting: Anesthesiology

## 2014-06-03 ENCOUNTER — Encounter (HOSPITAL_COMMUNITY): Payer: Self-pay | Admitting: *Deleted

## 2014-06-03 ENCOUNTER — Encounter (HOSPITAL_COMMUNITY)
Admission: RE | Disposition: A | Discharge status: Home / Self Care | Payer: Self-pay | Source: Ambulatory Visit | Attending: Obstetrics & Gynecology

## 2014-06-03 ENCOUNTER — Inpatient Hospital Stay (HOSPITAL_COMMUNITY)
Admission: RE | Admit: 2014-06-03 | Discharge: 2014-06-06 | DRG: 765 | Disposition: A | Discharge status: Home / Self Care | Payer: BLUE CROSS/BLUE SHIELD | Source: Ambulatory Visit | Attending: Obstetrics & Gynecology | Admitting: Obstetrics & Gynecology

## 2014-06-03 DIAGNOSIS — Z3A36 36 weeks gestation of pregnancy: Secondary | ICD-10-CM | POA: Diagnosis present

## 2014-06-03 DIAGNOSIS — Z302 Encounter for sterilization: Secondary | ICD-10-CM

## 2014-06-03 DIAGNOSIS — Z98891 History of uterine scar from previous surgery: Secondary | ICD-10-CM

## 2014-06-03 DIAGNOSIS — O99214 Obesity complicating childbirth: Secondary | ICD-10-CM | POA: Diagnosis present

## 2014-06-03 DIAGNOSIS — Z3483 Encounter for supervision of other normal pregnancy, third trimester: Secondary | ICD-10-CM | POA: Diagnosis present

## 2014-06-03 DIAGNOSIS — O3421 Maternal care for scar from previous cesarean delivery: Secondary | ICD-10-CM | POA: Diagnosis present

## 2014-06-03 DIAGNOSIS — O34219 Maternal care for unspecified type scar from previous cesarean delivery: Secondary | ICD-10-CM | POA: Diagnosis present

## 2014-06-03 DIAGNOSIS — E669 Obesity, unspecified: Secondary | ICD-10-CM | POA: Diagnosis present

## 2014-06-03 DIAGNOSIS — Z3A39 39 weeks gestation of pregnancy: Secondary | ICD-10-CM

## 2014-06-03 DIAGNOSIS — O133 Gestational [pregnancy-induced] hypertension without significant proteinuria, third trimester: Secondary | ICD-10-CM | POA: Diagnosis not present

## 2014-06-03 LAB — RPR: RPR: NONREACTIVE

## 2014-06-03 SURGERY — Surgical Case
Anesthesia: Spinal | Site: Abdomen | Laterality: Bilateral

## 2014-06-03 MED ORDER — SIMETHICONE 80 MG PO CHEW
80.0000 mg | CHEWABLE_TABLET | ORAL | Status: DC | PRN
  Filled 2014-06-03: qty 1

## 2014-06-03 MED ORDER — LANOLIN HYDROUS EX OINT: 1.0000 "application " | TOPICAL_OINTMENT | CUTANEOUS | Status: DC | PRN

## 2014-06-03 MED ORDER — OXYCODONE-ACETAMINOPHEN 5-325 MG PO TABS
1.0000 | ORAL_TABLET | ORAL | Status: DC | PRN
  Administered 2014-06-04 – 2014-06-06 (×2): 1 via ORAL
  Filled 2014-06-03 (×2): qty 1

## 2014-06-03 MED ORDER — NALBUPHINE HCL 10 MG/ML IJ SOLN: 5.0000 mg | INTRAMUSCULAR | Status: DC | PRN

## 2014-06-03 MED ORDER — OXYTOCIN 10 UNIT/ML IJ SOLN
INTRAMUSCULAR | Status: AC
Start: 2014-06-03 — End: 2014-06-03
  Filled 2014-06-03: qty 4

## 2014-06-03 MED ORDER — FENTANYL CITRATE (PF) 100 MCG/2ML IJ SOLN
INTRAMUSCULAR | Status: AC
  Filled 2014-06-03: qty 2

## 2014-06-03 MED ORDER — LACTATED RINGERS IV SOLN
INTRAVENOUS | Status: DC
  Administered 2014-06-04: 01:00:00 via INTRAVENOUS

## 2014-06-03 MED ORDER — TETANUS-DIPHTH-ACELL PERTUSSIS 5-2.5-18.5 LF-MCG/0.5 IM SUSP: 0.5000 mL | Freq: Once | INTRAMUSCULAR | Status: DC

## 2014-06-03 MED ORDER — LACTATED RINGERS IV SOLN
40.0000 [IU] | INTRAVENOUS | Status: DC | PRN
  Administered 2014-06-03: 40 [IU] via INTRAVENOUS

## 2014-06-03 MED ORDER — SODIUM CHLORIDE 0.9 % IJ SOLN: 3.0000 mL | INTRAMUSCULAR | Status: DC | PRN

## 2014-06-03 MED ORDER — OXYTOCIN 40 UNITS IN LACTATED RINGERS INFUSION - SIMPLE MED: 62.5000 mL/h | INTRAVENOUS | Status: AC

## 2014-06-03 MED ORDER — NALBUPHINE HCL 10 MG/ML IJ SOLN: 5.0000 mg | Freq: Once | INTRAMUSCULAR | Status: AC | PRN

## 2014-06-03 MED ORDER — LACTATED RINGERS IV SOLN
Freq: Once | INTRAVENOUS | Status: AC
  Administered 2014-06-03: 13:00:00 via INTRAVENOUS

## 2014-06-03 MED ORDER — NALOXONE HCL 1 MG/ML IJ SOLN: 1.0000 ug/kg/h | INTRAVENOUS | Status: DC | PRN

## 2014-06-03 MED ORDER — DIPHENHYDRAMINE HCL 25 MG PO CAPS
25.0000 mg | ORAL_CAPSULE | Freq: Four times a day (QID) | ORAL | Status: DC | PRN
  Administered 2014-06-04: 25 mg via ORAL
  Filled 2014-06-03: qty 1

## 2014-06-03 MED ORDER — CEFAZOLIN SODIUM-DEXTROSE 2-3 GM-% IV SOLR
2.0000 g | INTRAVENOUS | Status: AC
  Administered 2014-06-03: 2 g via INTRAVENOUS

## 2014-06-03 MED ORDER — DIPHENHYDRAMINE HCL 25 MG PO CAPS
25.0000 mg | ORAL_CAPSULE | ORAL | Status: DC | PRN
  Administered 2014-06-04 (×2): 25 mg via ORAL
  Filled 2014-06-03 (×2): qty 1

## 2014-06-03 MED ORDER — KETOROLAC TROMETHAMINE 30 MG/ML IJ SOLN: 30.0000 mg | Freq: Four times a day (QID) | INTRAMUSCULAR | Status: AC | PRN

## 2014-06-03 MED ORDER — WITCH HAZEL-GLYCERIN EX PADS: 1.0000 "application " | MEDICATED_PAD | CUTANEOUS | Status: DC | PRN

## 2014-06-03 MED ORDER — DIBUCAINE 1 % RE OINT: 1.0000 "application " | TOPICAL_OINTMENT | RECTAL | Status: DC | PRN

## 2014-06-03 MED ORDER — CEFAZOLIN SODIUM-DEXTROSE 2-3 GM-% IV SOLR
INTRAVENOUS | Status: AC
  Filled 2014-06-03: qty 50

## 2014-06-03 MED ORDER — LACTATED RINGERS IV SOLN
INTRAVENOUS | Status: DC | PRN
  Administered 2014-06-03: 14:00:00 via INTRAVENOUS

## 2014-06-03 MED ORDER — MENTHOL 3 MG MT LOZG: 1.0000 | LOZENGE | OROMUCOSAL | Status: DC | PRN

## 2014-06-03 MED ORDER — ZOLPIDEM TARTRATE 5 MG PO TABS: 5.0000 mg | ORAL_TABLET | Freq: Every evening | ORAL | Status: DC | PRN

## 2014-06-03 MED ORDER — FENTANYL CITRATE (PF) 100 MCG/2ML IJ SOLN: 25.0000 ug | INTRAMUSCULAR | Status: DC | PRN

## 2014-06-03 MED ORDER — SENNOSIDES-DOCUSATE SODIUM 8.6-50 MG PO TABS
2.0000 | ORAL_TABLET | ORAL | Status: DC
  Administered 2014-06-04 – 2014-06-05 (×3): 2 via ORAL
  Filled 2014-06-03 (×3): qty 2

## 2014-06-03 MED ORDER — PRENATAL MULTIVITAMIN CH
1.0000 | ORAL_TABLET | Freq: Every day | ORAL | Status: DC
  Administered 2014-06-04 – 2014-06-06 (×3): 1 via ORAL
  Filled 2014-06-03 (×3): qty 1

## 2014-06-03 MED ORDER — BUPIVACAINE HCL (PF) 0.25 % IJ SOLN
INTRAMUSCULAR | Status: DC | PRN
  Administered 2014-06-03: 30 mL

## 2014-06-03 MED ORDER — KETOROLAC TROMETHAMINE 30 MG/ML IJ SOLN
30.0000 mg | Freq: Four times a day (QID) | INTRAMUSCULAR | Status: AC | PRN
  Administered 2014-06-03: 30 mg via INTRAVENOUS
  Filled 2014-06-03: qty 1

## 2014-06-03 MED ORDER — IBUPROFEN 600 MG PO TABS
600.0000 mg | ORAL_TABLET | Freq: Four times a day (QID) | ORAL | Status: DC
  Administered 2014-06-04 – 2014-06-06 (×11): 600 mg via ORAL
  Filled 2014-06-03 (×12): qty 1

## 2014-06-03 MED ORDER — SIMETHICONE 80 MG PO CHEW
80.0000 mg | CHEWABLE_TABLET | ORAL | Status: DC
  Administered 2014-06-04 – 2014-06-05 (×3): 80 mg via ORAL
  Filled 2014-06-03 (×3): qty 1

## 2014-06-03 MED ORDER — MEPERIDINE HCL 25 MG/ML IJ SOLN: 6.2500 mg | INTRAMUSCULAR | Status: DC | PRN

## 2014-06-03 MED ORDER — ACETAMINOPHEN 325 MG PO TABS: 650.0000 mg | ORAL_TABLET | ORAL | Status: DC | PRN

## 2014-06-03 MED ORDER — SIMETHICONE 80 MG PO CHEW
80.0000 mg | CHEWABLE_TABLET | Freq: Three times a day (TID) | ORAL | Status: DC
  Administered 2014-06-03 – 2014-06-06 (×8): 80 mg via ORAL
  Filled 2014-06-03 (×8): qty 1

## 2014-06-03 MED ORDER — PHENYLEPHRINE 8 MG IN D5W 100 ML (0.08MG/ML) PREMIX OPTIME
INJECTION | INTRAVENOUS | Status: AC
  Filled 2014-06-03: qty 100

## 2014-06-03 MED ORDER — ONDANSETRON HCL 4 MG/2ML IJ SOLN
INTRAMUSCULAR | Status: AC
  Filled 2014-06-03: qty 2

## 2014-06-03 MED ORDER — LACTATED RINGERS IV SOLN
INTRAVENOUS | Status: DC
  Administered 2014-06-03 (×3): via INTRAVENOUS

## 2014-06-03 MED ORDER — FENTANYL CITRATE (PF) 100 MCG/2ML IJ SOLN
INTRAMUSCULAR | Status: DC | PRN
  Administered 2014-06-03: 10 ug via INTRATHECAL

## 2014-06-03 MED ORDER — NALOXONE HCL 0.4 MG/ML IJ SOLN: 0.4000 mg | INTRAMUSCULAR | Status: DC | PRN

## 2014-06-03 MED ORDER — ONDANSETRON HCL 4 MG/2ML IJ SOLN
INTRAMUSCULAR | Status: DC | PRN
  Administered 2014-06-03: 4 mg via INTRAVENOUS

## 2014-06-03 MED ORDER — DIPHENHYDRAMINE HCL 50 MG/ML IJ SOLN: 12.5000 mg | INTRAMUSCULAR | Status: DC | PRN

## 2014-06-03 MED ORDER — BUPIVACAINE IN DEXTROSE 0.75-8.25 % IT SOLN
INTRATHECAL | Status: DC | PRN
  Administered 2014-06-03: 1.8 mL via INTRATHECAL

## 2014-06-03 MED ORDER — MORPHINE SULFATE (PF) 0.5 MG/ML IJ SOLN
INTRAMUSCULAR | Status: DC | PRN
  Administered 2014-06-03: .2 mg via INTRATHECAL

## 2014-06-03 MED ORDER — SCOPOLAMINE 1 MG/3DAYS TD PT72
MEDICATED_PATCH | TRANSDERMAL | Status: AC
  Administered 2014-06-03: 1.5 mg via TRANSDERMAL
  Filled 2014-06-03: qty 1

## 2014-06-03 MED ORDER — NALBUPHINE HCL 10 MG/ML IJ SOLN
5.0000 mg | INTRAMUSCULAR | Status: DC | PRN
  Filled 2014-06-03: qty 1

## 2014-06-03 MED ORDER — BUPIVACAINE HCL (PF) 0.25 % IJ SOLN
INTRAMUSCULAR | Status: AC
  Filled 2014-06-03: qty 30

## 2014-06-03 MED ORDER — SCOPOLAMINE 1 MG/3DAYS TD PT72
1.0000 | MEDICATED_PATCH | Freq: Once | TRANSDERMAL | Status: DC
  Administered 2014-06-03: 1.5 mg via TRANSDERMAL

## 2014-06-03 MED ORDER — SCOPOLAMINE 1 MG/3DAYS TD PT72
1.0000 | MEDICATED_PATCH | Freq: Once | TRANSDERMAL | Status: DC
  Filled 2014-06-03: qty 1

## 2014-06-03 MED ORDER — ONDANSETRON HCL 4 MG/2ML IJ SOLN: 4.0000 mg | Freq: Three times a day (TID) | INTRAMUSCULAR | Status: DC | PRN

## 2014-06-03 MED ORDER — PHENYLEPHRINE 8 MG IN D5W 100 ML (0.08MG/ML) PREMIX OPTIME
INJECTION | INTRAVENOUS | Status: DC | PRN
  Administered 2014-06-03: 60 ug/min via INTRAVENOUS

## 2014-06-03 MED ORDER — NALBUPHINE HCL 10 MG/ML IJ SOLN
5.0000 mg | Freq: Once | INTRAMUSCULAR | Status: AC | PRN
  Administered 2014-06-03: 5 mg via SUBCUTANEOUS

## 2014-06-03 MED ORDER — OXYCODONE-ACETAMINOPHEN 5-325 MG PO TABS
2.0000 | ORAL_TABLET | ORAL | Status: DC | PRN
  Administered 2014-06-04 – 2014-06-05 (×4): 2 via ORAL
  Filled 2014-06-03 (×4): qty 2

## 2014-06-03 MED ORDER — MORPHINE SULFATE 0.5 MG/ML IJ SOLN
INTRAMUSCULAR | Status: AC
  Filled 2014-06-03: qty 10

## 2014-06-03 MED ORDER — ONDANSETRON HCL 4 MG/2ML IJ SOLN: 4.0000 mg | Freq: Once | INTRAMUSCULAR | Status: DC | PRN

## 2014-06-03 SURGICAL SUPPLY — 34 items
BENZOIN TINCTURE PRP APPL 2/3 (GAUZE/BANDAGES/DRESSINGS) ×3 IMPLANT
CLAMP CORD UMBIL (MISCELLANEOUS) ×3 IMPLANT
CLIP FILSHIE TUBAL LIGA STRL (Clip) ×3 IMPLANT
CLOSURE WOUND 1/2 X4 (GAUZE/BANDAGES/DRESSINGS) ×1
CLOTH BEACON ORANGE TIMEOUT ST (SAFETY) ×3 IMPLANT
DRAPE SHEET LG 3/4 BI-LAMINATE (DRAPES) ×3 IMPLANT
DRSG OPSITE POSTOP 4X10 (GAUZE/BANDAGES/DRESSINGS) ×3 IMPLANT
DURAPREP 26ML APPLICATOR (WOUND CARE) ×3 IMPLANT
ELECT REM PT RETURN 9FT ADLT (ELECTROSURGICAL) ×3
ELECTRODE REM PT RTRN 9FT ADLT (ELECTROSURGICAL) ×1 IMPLANT
EXTRACTOR VACUUM M CUP 4 TUBE (SUCTIONS) IMPLANT
EXTRACTOR VACUUM M CUP 4' TUBE (SUCTIONS)
GLOVE BIOGEL PI IND STRL 7.0 (GLOVE) ×1 IMPLANT
GLOVE BIOGEL PI INDICATOR 7.0 (GLOVE) ×2
GLOVE ECLIPSE 7.0 STRL STRAW (GLOVE) ×6 IMPLANT
GOWN STRL NON-REIN LRG LVL3 (GOWN DISPOSABLE) ×3 IMPLANT
GOWN STRL REUS W/TWL LRG LVL3 (GOWN DISPOSABLE) ×6 IMPLANT
KIT ABG SYR 3ML LUER SLIP (SYRINGE) IMPLANT
NEEDLE HYPO 22GX1.5 SAFETY (NEEDLE) ×3 IMPLANT
NEEDLE HYPO 25X5/8 SAFETYGLIDE (NEEDLE) IMPLANT
NS IRRIG 1000ML POUR BTL (IV SOLUTION) ×3 IMPLANT
PACK C SECTION WH (CUSTOM PROCEDURE TRAY) ×3 IMPLANT
PAD ABD 7.5X8 STRL (GAUZE/BANDAGES/DRESSINGS) ×3 IMPLANT
PAD OB MATERNITY 4.3X12.25 (PERSONAL CARE ITEMS) ×3 IMPLANT
RTRCTR C-SECT PINK 25CM LRG (MISCELLANEOUS) ×3 IMPLANT
SPONGE GAUZE 4X4 12PLY STER LF (GAUZE/BANDAGES/DRESSINGS) ×3 IMPLANT
STAPLER VISISTAT 35W (STAPLE) IMPLANT
STRIP CLOSURE SKIN 1/2X4 (GAUZE/BANDAGES/DRESSINGS) ×2 IMPLANT
SUT VIC AB 0 CTX 36 (SUTURE) ×6
SUT VIC AB 0 CTX36XBRD ANBCTRL (SUTURE) ×3 IMPLANT
SUT VIC AB 4-0 KS 27 (SUTURE) ×3 IMPLANT
SYR 30ML LL (SYRINGE) ×3 IMPLANT
TOWEL OR 17X24 6PK STRL BLUE (TOWEL DISPOSABLE) ×3 IMPLANT
TRAY FOLEY CATH SILVER 14FR (SET/KITS/TRAYS/PACK) ×3 IMPLANT

## 2014-06-03 NOTE — Op Note (Signed)
Preoperative Diagnosis:  IUP @ 4313w0d, undesired fertility, multiparity  Postoperative Diagnosis:  Same  Procedure: repeat low transverse cesarean section  Surgeon: Tinnie Gensanya Pratt, M.D.  Assistant: Fredirick LatheKristy Jonetta Dagley  Findings: Viable female infant, APGAR (1 MIN): 8   APGAR (5 MINS): 9     Estimated blood loss: 850cc  Complications: None known  Specimens: Placenta to labor and delivery  Reason for procedure: Briefly, the patient is a 29 y.o. E4V4098G4P0213 4013w0d who presents for repeat cesarean section after two previous kerr and bilateral tubal ligation  Procedure: Patient is a to the OR where spinal analgesia was administered. She was then placed in a supine position with left lateral tilt. She received 2 g of Ancef and SCDs were in place. A timeout was performed. She was prepped and draped in the usual sterile fashion. A Foley catheter was placed in the bladder. A knife was then used to make a Pfannenstiel incision. This incision was carried out to underlying fascia which was divided in the midline with the knife. The incision was extended laterally, sharply.  The abdominal wall was adhered to fascia, the rectus was divided in the midline.  The peritoneal cavity was entered bluntly.  Alexis retractor was placed inside the incision.  High bladder taken down with metzenbaum and pick-ups.  A knife was used to make a low transverse incision on the uterus. This incision was carried down to the amniotic cavity was entered. Fetus was in vertex position and was brought up out of the incision without difficulty. Cord was clamped x 2 and cut. Infant taken to waiting pediatrician.  Cord blood was obtained. Placenta was delivered from the uterus.  Uterus was cleaned with dry lap pads. Uterine incision closed with 0 Vicryl suture in a locked running fashion.  The left fallopian tube was identified and grasped with a Babcock clamp, and followed out to the fimbriated end.  A Filshie clip was placed on the left fallopian  tube about 2 cm from the cornu.  A similar process was carried out on the right side allowing for bilateral tubal sterilization.  Good hemostasis was noted overall.  Local analgesia was injected into both Filshie application sites.  Alexis retractor was removed from the abdomen. Peritoneal closure was done with 0 Vicryl suture.  Fascia is closed with 0 Vicryl suture in a running fashion. Subcutaneous tissue infused with 30cc 0.25% Marcaine.  Subcutaneous closure was performed with 0 plain suture.  Skin closed using 3-0 Vicryl on a Keith needle.  Steri strips applied, followed by pressure dressing.  All instrument, needle and lap counts were correct x 2.  Patient was awake and taken to PACU stable.  Infant to Newborn Nursery, stable.     Kaelan Emami ROCIOMD 06/03/2014 2:36 PM

## 2014-06-03 NOTE — Anesthesia Postprocedure Evaluation (Signed)
  Anesthesia Post-op Note  Patient: Kathryn Valentine  Procedure(s) Performed: Procedure(s) (LRB): REPEAT CESAREAN SECTION WITH BILATERAL TUBAL LIGATION (Bilateral)  Patient Location: PACU  Anesthesia Type: Spinal  Level of Consciousness: awake and alert   Airway and Oxygen Therapy: Patient Spontanous Breathing  Post-op Pain: mild  Post-op Assessment: Post-op Vital signs reviewed, Patient's Cardiovascular Status Stable, Respiratory Function Stable, Patent Airway and No signs of Nausea or vomiting  Last Vitals:  Filed Vitals:   06/03/14 1630  BP: 113/67  Pulse: 69  Temp: 36.6 C  Resp: 18    Post-op Vital Signs: stable   Complications: No apparent anesthesia complications

## 2014-06-03 NOTE — Transfer of Care (Signed)
Immediate Anesthesia Transfer of Care Note  Patient: Kathryn Valentine  Procedure(s) Performed: Procedure(s): REPEAT CESAREAN SECTION WITH BILATERAL TUBAL LIGATION (Bilateral)  Patient Location: PACU  Anesthesia Type:Spinal  Level of Consciousness: awake, alert  and oriented  Airway & Oxygen Therapy: Patient Spontanous Breathing and Patient connected to nasal cannula oxygen  Post-op Assessment: Report given to RN and Post -op Vital signs reviewed and stable  Post vital signs: Reviewed and stable  Last Vitals:  Filed Vitals:   06/03/14 1247  BP: 112/74  Pulse: 76  Temp: 36.5 C  Resp: 20    Complications: No apparent anesthesia complications

## 2014-06-03 NOTE — Interval H&P Note (Signed)
History and Physical Interval Note:  06/03/2014 1:21 PM  Kathryn Valentine  has presented today for surgery, with the diagnosis of Repeat cesareansection and undesired fertility  The various methods of treatment have been discussed with the patient and family. After consideration of risks, benefits and other options for treatment, the patient has consented to  Procedure(s): REPEAT CESAREAN SECTION WITH BILATERAL TUBAL LIGATION (Bilateral) as a surgical intervention .  The patient's history has been reviewed, patient examined, no change in status, stable for surgery.  I have reviewed the patient's chart and labs.  Questions were answered to the patient's satisfaction.     [name]

## 2014-06-03 NOTE — Lactation Note (Signed)
This note was copied from the chart of Kathryn Valentine. Lactation Consultation Note  Patient Name: Kathryn Vevelyn FrancoisMikafui Valentine ZOXWR'UToday's Date: 06/03/2014 Reason for consult: Initial assessment Visited with Mom, baby skin to skin, on Mom's chest at 4 hrs old.  Mom has semi-flat nipples, with very compressible areola.  Baby too sleepy to latch with LC help.  Teaching done.  Encouraged to continue skin to skin, and feeding on cue.   Brochure left with Mom.  Informed her of IP and OP lactation services available to her.  Encouraged to call for help prn.  To follow up in am.  Consult Status Consult Status: Follow-up Date: 06/04/14 Follow-up type: In-patient    Judee ClaraSmith, Loras Grieshop E 06/03/2014, 6:43 PM

## 2014-06-03 NOTE — Anesthesia Procedure Notes (Signed)
Spinal Patient location during procedure: OR Staffing Anesthesiologist: Zarielle Cea Performed by: anesthesiologist  Preanesthetic Checklist Completed: patient identified, site marked, surgical consent, pre-op evaluation, timeout performed, IV checked, risks and benefits discussed and monitors and equipment checked Spinal Block Patient position: sitting Prep: ChloraPrep Patient monitoring: continuous pulse ox, blood pressure and heart rate Approach: midline Location: L3-4 Injection technique: single-shot Needle Needle type: Sprotte  Needle gauge: 24 G Needle length: 9 cm Assessment Sensory level: T4 Additional Notes Functioning IV was confirmed and monitors were applied. Sterile prep and drape, including hand hygiene, mask and sterile gloves were used. The patient was positioned and the spine was prepped. The skin was anesthetized with lidocaine.  Free flow of clear CSF was obtained prior to injecting local anesthetic into the CSF.  The spinal needle aspirated freely following injection.  The needle was carefully withdrawn.  The patient tolerated the procedure well. Consent was obtained prior to procedure with all questions answered and concerns addressed. Risks including but not limited to bleeding, infection, nerve damage, paralysis, failed block, inadequate analgesia, allergic reaction, high spinal, itching and headache were discussed and the patient wished to proceed.   Karie SchwalbeMary Shontay Wallner, MD

## 2014-06-04 DIAGNOSIS — Z98891 History of uterine scar from previous surgery: Secondary | ICD-10-CM

## 2014-06-04 NOTE — Addendum Note (Signed)
Addendum  created 06/04/14 1119 by Algis GreenhouseLinda A Nyia Tsao, CRNA   Modules edited: Notes Section   Notes Section:  File: [id number]

## 2014-06-04 NOTE — Anesthesia Postprocedure Evaluation (Signed)
Anesthesia Post Note  Patient: Kathryn Valentine  Procedure(s) Performed: Procedure(s) (LRB): REPEAT CESAREAN SECTION WITH BILATERAL TUBAL LIGATION (Bilateral)  Anesthesia type: Spinal  Patient location: Mother/Baby  Post pain: Pain level controlled  Post assessment: Post-op Vital signs reviewed  Last Vitals:  Filed Vitals:   06/04/14 0832  BP: 105/60  Pulse: 62  Temp: 37 C  Resp: 20    Post vital signs: Reviewed  Level of consciousness: awake  Complications: No apparent anesthesia complications

## 2014-06-04 NOTE — Progress Notes (Signed)
Post-op Day 1  Subjective:  Kathryn Valentine is a 29 y.o. [id number] 685w0d s/p LTCS with BTL.  No acute events overnight.  Pt denies problems with ambulating, voiding or po intake.  She denies nausea or vomiting.  Pain is moderately controlled.  She has not had flatus. She has not had bowel movement.  Lochia Small.  Plan for birth control is bilateral tubal ligation.  Method of Feeding: Breast.   Patient's only concern is her itching. States that is is mildly improved with benadryl. Itching only occurs when patient in bed. Says she does nto itch when up and out of bed.   Objective: BP 100/60 mmHg  Pulse 70  Temp(Src) 97.6 F (36.4 C) (Oral)  Resp 18  SpO2 100%  Breastfeeding? Unknown  Physical Exam:  General: alert, cooperative and no distress Lochia:normal flow Chest: CTAB Heart: RRR no m/r/g Abdomen: +BS, soft, nontender, fundus firm, incision c/d/i DVT Evaluation: No evidence of DVT seen on physical exam. Extremities: no edema   Recent Labs  06/02/14 0955  HGB 12.1  HCT 35.5*    Assessment/Plan:  ASSESSMENT: Kathryn Valentine is a 29 y.o. [id number] 725w0d pod #1 s/p LTCS with BTL doing well.   Plan for discharge tomorrow  Monitor itching and treat prn Continue rountine post-partum care   LOS: 1 day   Caryl AdaJazma Phelps, DO 06/04/2014, 8:21 AM PGY-1, [facility]

## 2014-06-04 NOTE — Progress Notes (Signed)
IV-LR output

## 2014-06-05 NOTE — Progress Notes (Signed)
Post Partum Day 2 Subjective:  Na E Antonio-Obese is a [age] y.o. [id number] 5069w0d s/p rLTCS with BTL.  No acute events overnight.  Pt reports problems with ambulating 2/2 pain but no problems with voiding or po intake.  She denies nausea or vomiting.  Pain is moderately controlled.  She has NOT had flatus but tolerating PO well. She has not had bowel movement.  Lochia Minimal.  Method of Feeding: breast  Objective: Blood pressure 108/55, pulse 75, temperature 98.4 F (36.9 C), temperature source Oral, resp. rate 16, SpO2 100 %, unknown if currently breastfeeding.  Physical Exam:  General: alert, cooperative and no distress Lochia:normal flow Chest: CTAB Heart: RRR no m/r/g Abdomen: +BS, soft, nontender,  Uterine Fundus: firm DVT Evaluation: No evidence of DVT seen on physical exam. Extremities: no edema   Recent Labs  06/02/14 0955  HGB 12.1  HCT 35.5*    Assessment/Plan:  ASSESSMENT: Acquanetta BellingMikafui E Antonio-Obese is a 29 y.o. A5W0981G4P1213 9769w0d s/p rLTCS with BTL  Plan for discharge tomorrow  Ambulation encouraged, at this time patient not ambulating or moving in bed frequently enough   LOS: 2 days   Letty Salvi ROCIO 06/05/2014, 7:45 AM

## 2014-06-06 ENCOUNTER — Encounter (HOSPITAL_COMMUNITY): Payer: Self-pay | Admitting: Family Medicine

## 2014-06-06 MED ORDER — SENNOSIDES-DOCUSATE SODIUM 8.6-50 MG PO TABS: 2.0000 | ORAL_TABLET | ORAL | Status: DC

## 2014-06-06 MED ORDER — OXYCODONE-ACETAMINOPHEN 5-325 MG PO TABS: 1.0000 | ORAL_TABLET | ORAL | Status: DC | PRN

## 2014-06-06 MED ORDER — IBUPROFEN 600 MG PO TABS: 600.0000 mg | ORAL_TABLET | Freq: Four times a day (QID) | ORAL | Status: DC

## 2014-06-06 NOTE — Lactation Note (Signed)
This note was copied from the chart of Kathryn Chey Valentine. Lactation Consultation Note BF is going well.  Mom has an abundance of milk and is planning a 2 week rental.   Patient Name: Kathryn Valentine WUJWJ'XToday's Date: 06/06/2014     Maternal Data    Feeding Feeding Type: Bottle Fed - Breast Milk Length of feed: 15 min  LATCH Score/Interventions                      Lactation Tools Discussed/Used     Consult Status      Soyla DryerJoseph, [name] 06/06/2014, 8:54 AM

## 2014-06-06 NOTE — Discharge Summary (Signed)
Obstetric Discharge Summary Reason for Admission: cesarean section prior cesarean not for TOLAC, Pregnancy 39 wk, Desire for sterility Prenatal Procedures: none Intrapartum Procedures: cesarean: low cervical, transverse and tubal ligation Postpartum Procedures: none and P.P. tubal ligation Complications-Operative and Postpartum: none        Expand All Collapse All   Preoperative Diagnosis: IUP @ [redacted]w[redacted]d, undesired fertility, multiparity  Postoperative Diagnosis: Same  Procedure: repeat low transverse cesarean section  Surgeon: Tinnie Gens, M.D.  Assistant: Fredirick Lathe  Findings: Viable female infant, APGAR (1 MIN): 8  APGAR (5 MINS): 9    Estimated blood loss: 850cc  Complications: None known  Specimens: Placenta to labor and delivery  Reason for procedure: Briefly, the patient is a 29 y.o. Z3G6440 [redacted]w[redacted]d who presents for repeat cesarean section after two previous kerr and bilateral tubal ligation  Procedure: Patient is a to the OR where spinal analgesia was administered. She was then placed in a supine position with left lateral tilt. She received 2 g of Ancef and SCDs were in place. A timeout was performed. She was prepped and draped in the usual sterile fashion. A Foley catheter was placed in the bladder. A knife was then used to make a Pfannenstiel incision. This incision was carried out to underlying fascia which was divided in the midline with the knife. The incision was extended laterally, sharply. The abdominal wall was adhered to fascia, the rectus was divided in the midline. The peritoneal cavity was entered bluntly. Alexis retractor was placed inside the incision. High bladder taken down with metzenbaum and pick-ups. A knife was used to make a low transverse incision on the uterus. This incision was carried down to the amniotic cavity was entered. Fetus was in vertex position and was brought up out of the incision without difficulty. Cord was clamped x 2 and cut.  Infant taken to waiting pediatrician. Cord blood was obtained. Placenta was delivered from the uterus. Uterus was cleaned with dry lap pads. Uterine incision closed with 0 Vicryl suture in a locked running fashion. The left fallopian tube was identified and grasped with a Babcock clamp, and followed out to the fimbriated end. A Filshie clip was placed on the left fallopian tube about 2 cm from the cornu. A similar process was carried out on the right side allowing for bilateral tubal sterilization. Good hemostasis was noted overall. Local analgesia was injected into both Filshie application sites. Alexis retractor was removed from the abdomen. Peritoneal closure was done with 0 Vicryl suture. Fascia is closed with 0 Vicryl suture in a running fashion. Subcutaneous tissue infused with 30cc 0.25% Marcaine. Subcutaneous closure was performed with 0 plain suture. Skin closed using 3-0 Vicryl on a Keith needle. Steri strips applied, followed by pressure dressing. All instrument, needle and lap counts were correct x 2. Patient was awake and taken to PACU stable. Infant to Newborn Nursery, stable.     ACOSTA,KRISTY ROCIOMD 06/03/2014 2:36 PM         Hospital Course:  Principal Problem:   Previous cesarean section complicating pregnancy Active Problems:   Encounter for sterilization   Previous cesarean section   Deaven E Antonio-Obese is a 29 y.o. H4V4259 s/p rLTCS.  Patient was admitted on 5/27.  She has postpartum course that was uncomplicated. The pt feels ready to go home and  will be discharged with outpatient follow-up.   Today: No acute events overnight.  Pt denies problems with ambulating, voiding or po intake.  She denies nausea or vomiting.  Pain  is moderately controlled.  She has had flatus. She has had bowel movement.  Lochia Minimal.  Plan for birth control is  bilateral tubal ligation.  Method of Feeding: breast  Physical Exam:  General: alert and cooperative Lochia:  appropriate Uterine Fundus: firm Incision: healing well, no significant drainage, no dehiscence, no significant erythema DVT Evaluation: No evidence of DVT seen on physical exam.  H/H: Lab Results  Component Value Date/Time   HGB 12.1 06/02/2014 09:55 AM   HGB 13.5 10/22/2013   HCT 35.5* 06/02/2014 09:55 AM   HCT 40 10/22/2013    Discharge Diagnoses: Term Pregnancy-delivered  Discharge Information: Date: 06/06/2014 Activity: pelvic rest Diet: routine  Medications: Ibuprofen and Percocet Breast feeding:  Yes Condition: stable Instructions: refer to handout Discharge to: home   Discharge Instructions    Activity as tolerated    Complete by:  As directed      Call MD for:  difficulty breathing, headache or visual disturbances    Complete by:  As directed      Call MD for:  extreme fatigue    Complete by:  As directed      Call MD for:  hives    Complete by:  As directed      Call MD for:  persistant dizziness or light-headedness    Complete by:  As directed      Call MD for:  persistant nausea and vomiting    Complete by:  As directed      Call MD for:  redness, tenderness, or signs of infection (pain, swelling, redness, odor or green/yellow discharge around incision site)    Complete by:  As directed      Call MD for:  severe uncontrolled pain    Complete by:  As directed      Call MD for:  temperature >100.4    Complete by:  As directed      Diet - low sodium heart healthy    Complete by:  As directed      Discharge instructions    Complete by:  As directed   Taking care of yourself after Baby arrives. Vaginal Bleeding: Vaginal bleeding is common after delivery, with the amount decreasing gradually over 1-2 weeks. If the bleeding increases, is mixed with pus, or is foul-smelling, call your doctor, as this may be a sign of infection.  Abdominal Pain: Abdominal cramping after delivery is common, especially when you breastfeed. The same hormones responsible for letting  milk down to your nipple also contract your uterus. If the pain worsens, or occurs more frequently over 48 hrs after delivery, call your doctor.  Fevers: After delivery you are at increased risk of developing an infection. If you have a fever, increased vaginal bleeding, foul-smelling vaginal discharge, or increased abdominal pain, call your doctor.  Breast Feeding: Feeding every 1.5-3 hours keeps Baby satisfied and your milk in good supply. If 3 hours have gone by and Baby is sleeping, wake him/her up to feed. Nurse for 15-20 minutes on one breast before offering the other. Breast-fed babies often lose up to 7% of their birth weight in the first few days of life, but should start gaining about an ounce per day after 4 days. For more information about breastfeeding, go to [employer].  Mastitis (Breast infection): Breaks in the skin or bacteria passing into your breast ducts can cause an infection. If you notice a triangular shaped area on your breast that is red, warm to the touch and tender,  call your doctor. It is safe for Baby and helps you to heal faster if you keep breast feeding through this infection.  Postpartum Depression: Postpartum depression is very common after a woman delivers because of all the hormonal changes happening in her body. If you notice that you start to feel more sad or anxious than usual or have any thoughts of hurting yourself or Baby, tell someone right away.  If you have any questions or concerns, please call your doctor.     Leave dressing on - Keep it clean, dry, and intact until clinic visit    Complete by:  As directed      Lifting restrictions    Complete by:  As directed   Weight restriction of 10-15 lbs.            Medication List    STOP taking these medications        amoxicillin 500 MG capsule  Commonly known as:  AMOXIL      TAKE these medications        aspirin EC 81 MG tablet  Take 81 mg by mouth daily.      butalbital-acetaminophen-caffeine 50-325-40 MG per tablet  Commonly known as:  FIORICET, ESGIC  Take 1 tablet by mouth every 6 (six) hours as needed for headache or migraine.     calcium carbonate 500 MG chewable tablet  Commonly known as:  TUMS - dosed in mg elemental calcium  Chew 1-2 tablets by mouth daily as needed for indigestion or heartburn. Depends on indigestion if takes 1 or 2 tablets     [employer] PRENATAL GUMMY PO  Take 2 tablets by mouth daily.     ibuprofen 600 MG tablet  Commonly known as:  ADVIL,MOTRIN  Take 1 tablet (600 mg total) by mouth every 6 (six) hours.     oxyCODONE-acetaminophen 5-325 MG per tablet  Commonly known as:  PERCOCET/ROXICET  Take 1 tablet by mouth every 4 (four) hours as needed (for pain scale 4-7).     senna-docusate 8.6-50 MG per tablet  Commonly known as:  Senokot-S  Take 2 tablets by mouth daily.         Elita Booneoberts, Caroline C ,MD OB Fellow 06/06/2014,10:54 AM

## 2014-06-30 ENCOUNTER — Telehealth: Payer: Self-pay | Admitting: *Deleted

## 2014-06-30 NOTE — Telephone Encounter (Signed)
Patient presented to front window of clinic asking to speak to a nurse.  Patient had a c/section recently and states her incision needs to be checked. Brief exam of incision reveals incision clean, dry, intact with some excess pink tissue extending slightly over incison. Patient wanting to know if she can shower and clean it.  I advised her she may shower but anything touching incision should be clean. Check incision daily and if incision opens or any problems call clinic. Reminded patient of her postpartum appointment next week. She voices understanding.

## 2014-07-06 ENCOUNTER — Encounter: Payer: Self-pay | Admitting: Obstetrics & Gynecology

## 2014-07-06 ENCOUNTER — Ambulatory Visit (INDEPENDENT_AMBULATORY_CARE_PROVIDER_SITE_OTHER): Payer: BLUE CROSS/BLUE SHIELD | Admitting: Obstetrics & Gynecology

## 2014-07-06 NOTE — Progress Notes (Signed)
Subjective:     Kathryn Valentine is a 29 y.o. [phone] female who presents for a postpartum visit. She is 4 weeks postpartum following a repeat cesarean section and BTS. I have fully reviewed the prenatal and intrapartum course. The delivery was at 39 gestational weeks. Postpartum course has been uncomplicated. Baby's course has been uncomplicated. Baby is feeding by breastfeeding. No vaginal bleeding . Bowel function is normal. Bladder function is normal. Patient is not currently sexually active; "husband is deployed". Contraception method is BTS on 06/03/14. Postpartum depression screening: negative.  The following portions of the patient's history were reviewed and updated as appropriate: allergies, current medications, past family history, past medical history, past social history, past surgical history and problem list.  Normal pap in 10/25/2013.  Review of Systems Pertinent items are noted in HPI.   Objective:    BP 115/68 mmHg  Pulse 76  Temp(Src) 98.5 F (36.9 C) (Oral)  Ht 5\' 5"  (1.651 m)  Wt 190 lb (86.183 kg)  BMI 31.62 kg/m2  Breastfeeding? Yes  General:  alert and no distress   Breasts:  inspection negative, no nipple discharge or bleeding, no masses or nodularity palpable  Lungs: clear to auscultation bilaterally  Heart:  regular rate and rhythm  Abdomen: soft, non-tender; bowel sounds normal; no masses,  no organomegaly  Pelvic:  not evaluated        Assessment:   Normal postpartum exam. Pap smear not done at today's visit.   Plan:   1. Contraception: tubal ligation 2. Follow up as needed.    Jaynie CollinsUGONNA  Aysel Gilchrest, MD, FACOG Attending Obstetrician & Gynecologist Center for [facility], [facility]

## 2014-10-31 ENCOUNTER — Encounter: Payer: Self-pay | Admitting: *Deleted

## 2014-11-14 ENCOUNTER — Encounter: Payer: Self-pay | Admitting: *Deleted

## 2015-10-04 ENCOUNTER — Encounter: Payer: Self-pay | Admitting: *Deleted

## 2017-01-24 IMAGING — US US OB COMP +14 WK
1 series · 12 of 28 positions shown · non-contrast
Comparison: none

OBSTETRICS REPORT
(Signed Final 05/04/2014 [DATE])

Service(s) Provided
US OB COMP + 14 WK                                    76805.1
Indications
Basic anatomic survey                                 [id number]
Poor obstetric history: Previous preterm delivery x 2
Poor obstetric history: Previous preeclampsia
34 weeks gestation of pregnancy
Size less than dates (Small for gestational age,
FGR)
Previous cesarean section x 2
Fetal Evaluation
Num Of Fetuses:    1
Fetal Heart Rate:  158                          bpm
Cardiac Activity:  Observed
Presentation:      Cephalic
Placenta:          Anterior, above cervical os
P. Cord            Visualized
Insertion:
Amniotic Fluid
AFI FV:      Subjectively within normal limits
AFI Sum:     15.19   cm       55  %Tile     Larg Pckt:    4.69  cm
RUQ:   3.71    cm   RLQ:    3.92   cm    LUQ:   4.69    cm   LLQ:    2.87   cm
Biometry
BPD:     81.4  mm     G. Age:  32w 5d                CI:        68.56   70 - 86
FL/HC:      20.5   20.1 -
22.3
HC:     314.2  mm     G. Age:  35w 2d       28  %    HC/AC:      1.08   0.93 -
1.11
AC:     292.1  mm     G. Age:  33w 1d       17  %    FL/BPD:     79.2   71 - 87
FL:      64.5  mm     G. Age:  33w 2d       11  %    FL/AC:      22.1   20 - 24
HUM:     57.5  mm     G. Age:  33w 2d       36  %
Est. FW:    6324  gm    4 lb 13 oz      35  %
Gestational Age
U/S Today:     33w 4d                                        EDD:   06/18/14
Best:          34w 5d     Det. By:  Early Ultrasound         EDD:   06/10/14
(01/14/14)
Anatomy
Cranium:          Appears normal         Aortic Arch:      Not well visualized
Fetal Cavum:      Not well visualized    Ductal Arch:      Not well visualized
Ventricles:       Not well visualized    Diaphragm:        Appears normal
Choroid Plexus:   Not well visualized    Stomach:          Appears normal, left
sided
Cerebellum:       Not well visualized    Abdomen:          Appears normal
Posterior Fossa:  Not well visualized    Abdominal Wall:   Not well visualized
Nuchal Fold:      Not applicable (>20    Cord Vessels:     Appears normal (3
wks GA)                                  vessel cord)
Face:             Orbits appear          Kidneys:          Appear normal
normal
Lips:             Not well visualized    Bladder:          Appears normal
Heart:            Appears normal         Spine:            Not well visualized
(4CH, axis, and
situs)
RVOT:             Appears normal         Lower             Not well visualized
Extremities:
LVOT:             Appears normal         Upper             Visualized
Other:  Fetus appears to be a male. Technically difficult due to advanced GA
and fetal position.
Cervix Uterus Adnexa
Cervix:       Not visualized (advanced GA >50wks)
Uterus:       No abnormality visualized.
Left Ovary:    Size(cm) L: 3.82 x W: 3.26 x H: 2.03  Volume(cc):
13.2
Right Ovary:   Size(cm) L: 4.02 x W: 3 x H: 2.57  Volume(cc):
Adnexa:     No abnormality visualized. No adnexal mass visualized.
Impression
INDICATION: 29 yr old [id number] at 70w8d with history of
preeclampsia for fetal ultrasound. Remote read.

[Series 1: us ob follow up · 70 acquisitions, 12 frames shown]
[im 3/70]
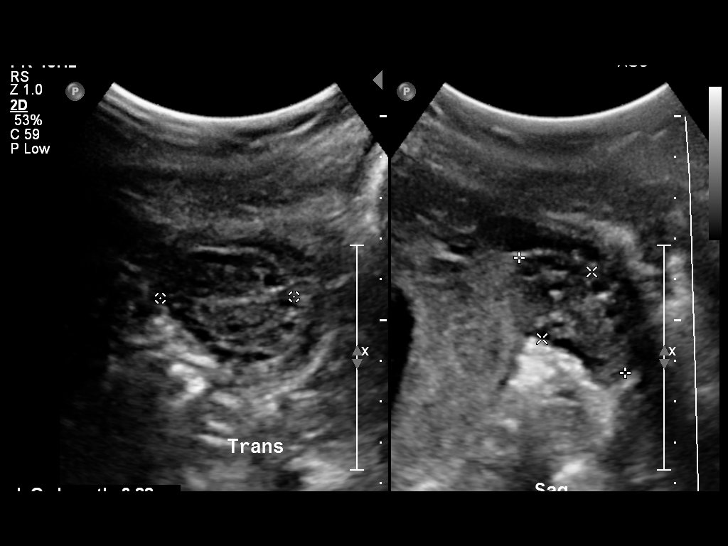
[im 8/70]
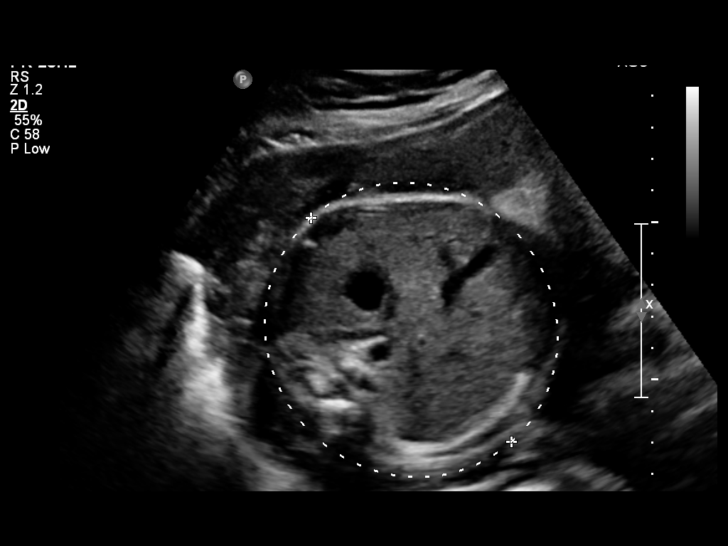
[im 13/70]
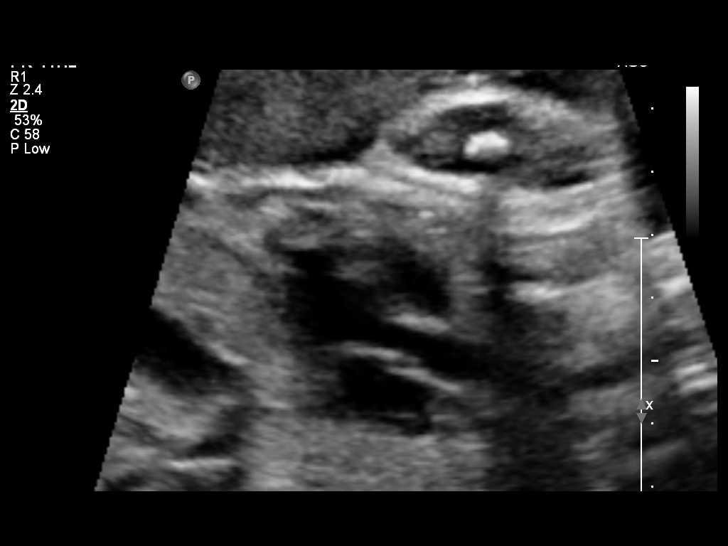
[im 21/70]
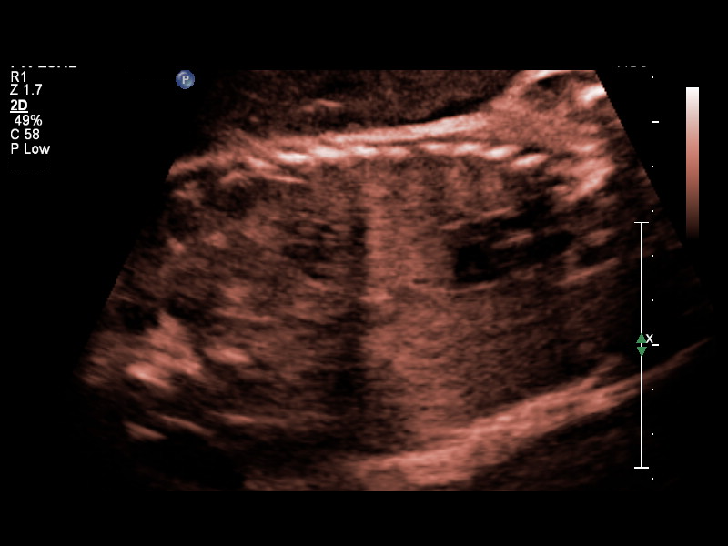
[im 26/70]
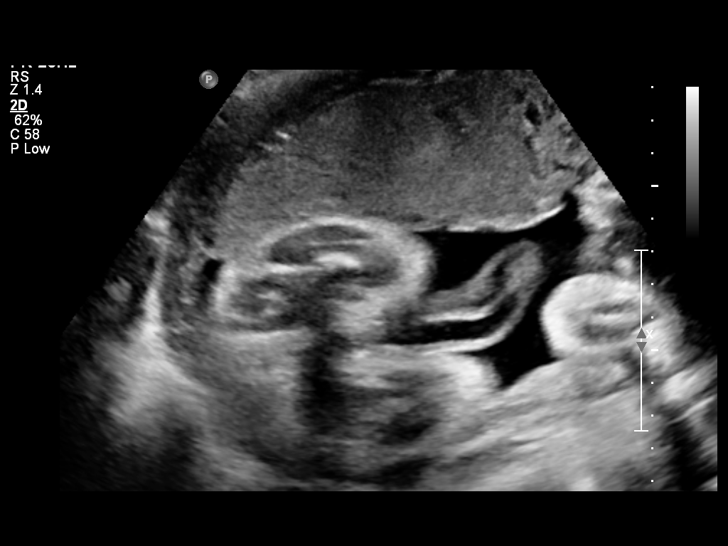
[im 31/70]
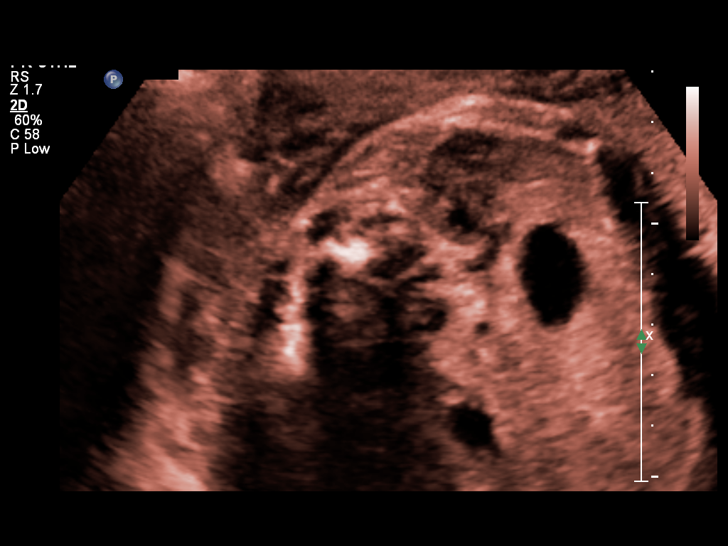
[im 39/70]
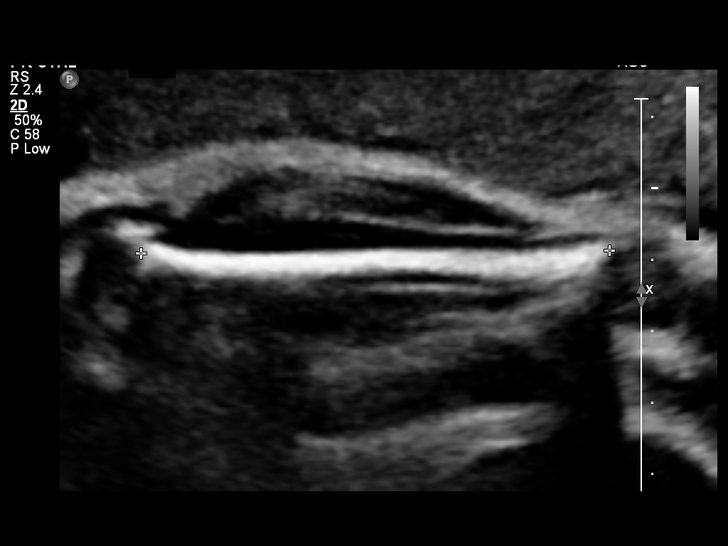
[im 44/70]
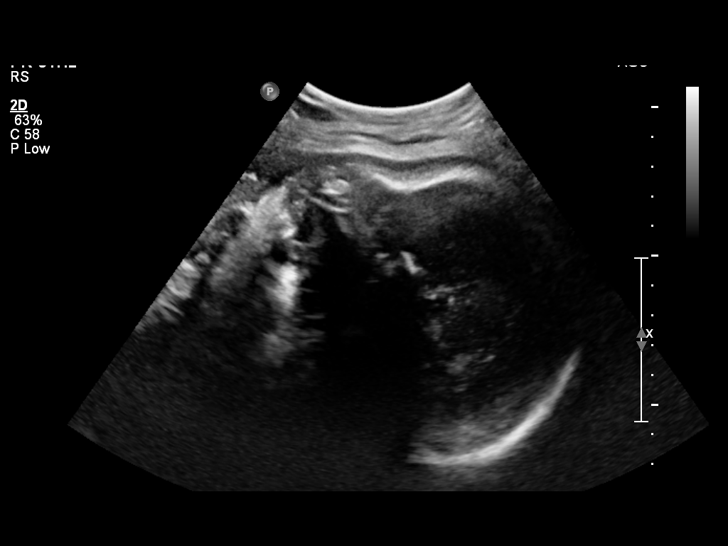
[im 49/70]
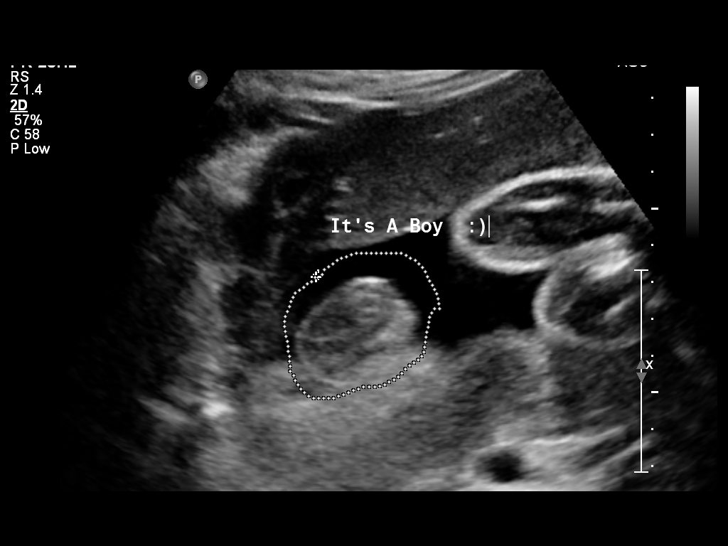
[im 57/70]
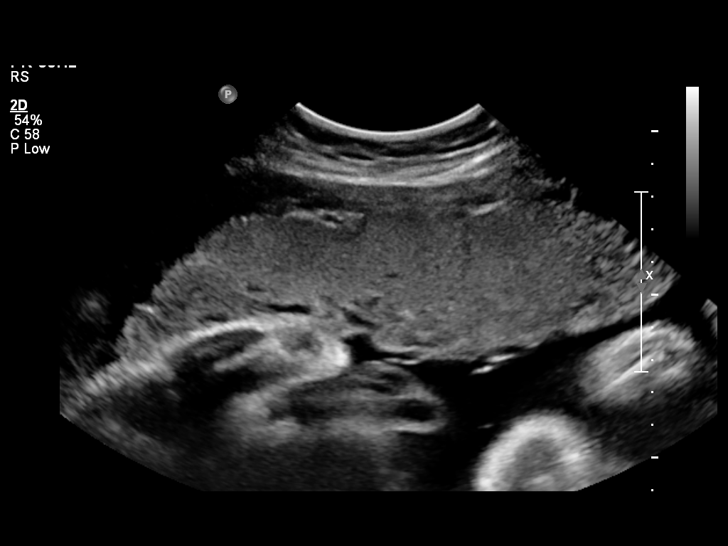
[im 62/70]
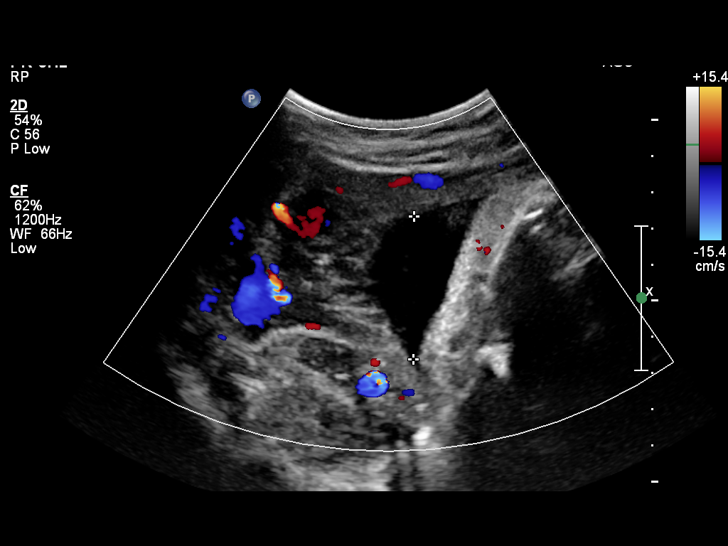
[im 67/70]
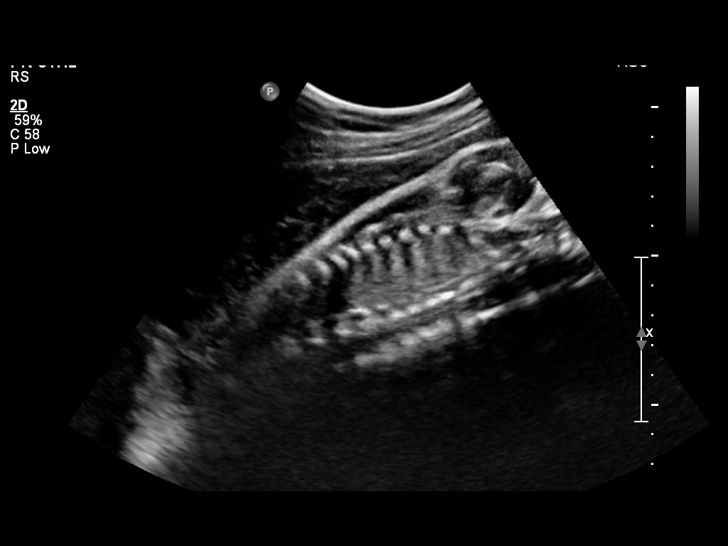

[12 of 28 positions shown; findings below may reference images not displayed]

FINDINGS: 1. Single intrauterine pregnancy.
2. Estimated fetal weight is in the 35th%.
3. Anterior placenta without evidence of previa.
4. Normal amniotic fluid index.
5. The anatomy survey is limited by advanced gestational
age as above; no abnormalities seen.
Recommendations

1. Appropriate fetal growth.
2. Normal limited anatomy survey.
3. History of preeclampsia:
- recommend close surveillance for the development of
signs/symptoms of preeclamspia
4. Recommend follow up ultrasounds as clinically indicated

## 2017-04-07 ENCOUNTER — Ambulatory Visit (INDEPENDENT_AMBULATORY_CARE_PROVIDER_SITE_OTHER): Payer: BC Managed Care – PPO | Admitting: Obstetrics and Gynecology

## 2017-04-07 ENCOUNTER — Encounter: Payer: Self-pay | Admitting: Obstetrics and Gynecology

## 2017-04-07 ENCOUNTER — Encounter: Payer: Self-pay | Admitting: *Deleted

## 2017-04-07 VITALS — BP 113/71 | HR 65 | Ht 67.0 in | Wt 193.0 lb

## 2017-04-07 DIAGNOSIS — Z1151 Encounter for screening for human papillomavirus (HPV): Secondary | ICD-10-CM

## 2017-04-07 DIAGNOSIS — Z01419 Encounter for gynecological examination (general) (routine) without abnormal findings: Secondary | ICD-10-CM | POA: Diagnosis not present

## 2017-04-07 DIAGNOSIS — Z113 Encounter for screening for infections with a predominantly sexual mode of transmission: Secondary | ICD-10-CM

## 2017-04-07 DIAGNOSIS — Z30430 Encounter for insertion of intrauterine contraceptive device: Secondary | ICD-10-CM

## 2017-04-07 DIAGNOSIS — Z124 Encounter for screening for malignant neoplasm of cervix: Secondary | ICD-10-CM

## 2017-04-07 DIAGNOSIS — Z3202 Encounter for pregnancy test, result negative: Secondary | ICD-10-CM | POA: Diagnosis not present

## 2017-04-07 DIAGNOSIS — R102 Pelvic and perineal pain: Secondary | ICD-10-CM

## 2017-04-07 LAB — POCT URINALYSIS DIPSTICK
Bilirubin, UA: NEGATIVE
Blood, UA: NEGATIVE
Glucose, UA: NEGATIVE
Ketones, UA: NEGATIVE
LEUKOCYTES UA: NEGATIVE
NITRITE UA: NEGATIVE
Protein, UA: NEGATIVE
SPEC GRAV UA: 1.01 (ref 1.010–1.025)
Urobilinogen, UA: 0.2 E.U./dL
pH, UA: 6.5 (ref 5.0–8.0)

## 2017-04-07 LAB — POCT URINE PREGNANCY: Preg Test, Ur: NEGATIVE

## 2017-04-07 MED ORDER — LEVONORGESTREL 20 MCG/24HR IU IUD
INTRAUTERINE_SYSTEM | Freq: Once | INTRAUTERINE | Status: AC
  Administered 2017-04-07: 1 via INTRAUTERINE

## 2017-04-07 MED ORDER — BUTALBITAL-APAP-CAFFEINE 50-325-40 MG PO TABS: 1.0000 | ORAL_TABLET | Freq: Four times a day (QID) | ORAL | 3 refills | Status: AC | PRN

## 2017-04-07 NOTE — Progress Notes (Signed)
Subjective:     Kathryn Valentine is a 32 y.o. female [phone] with LMP 03/24/2017 and BMI 30 who is here for a comprehensive physical exam. The patient reports a long standing history of irregular cycle. She states that she often has a cycle every 3 months since the onset of menarche. She was diagnosed with PCOS. She also describes the onset of a sharp bilateral lower quadrant pain since 2016. She states the pain is of sudden onset without any clear triggering factors; the pain is non-radiating and often relieved with rest or warm baths. Patient is sexually active without complaints using BTL for contraception. She denies any abnormal discharge. She desires full STI testing  Past Medical History:  Diagnosis Date  . Cesarean delivery delivered 10/09/2009   breech presentation  . Headache   . Heartburn in pregnancy    tums  . Pregnancy induced hypertension    History with previous pregnancy, none with 2016 pregnancy  . Preterm labor 11/2010   Past Surgical History:  Procedure Laterality Date  . CESAREAN SECTION  10/2009  . CESAREAN SECTION  11/30/2010   Procedure: CESAREAN SECTION;  Surgeon: Kathreen Cosier, MD;  Location: [facility];  Service: Gynecology;  Laterality: N/A;  . CESAREAN SECTION WITH BILATERAL TUBAL LIGATION Bilateral 06/03/2014   Procedure: REPEAT CESAREAN SECTION WITH BILATERAL TUBAL LIGATION;  Surgeon: Reva Bores, MD;  Location: [facility];  Service: Obstetrics;  Laterality: Bilateral;   Family History  Problem Relation Age of Onset  . Diabetes Maternal Grandfather   . Diabetes Paternal Grandmother     Social History   Socioeconomic History  . Marital status: Married    Spouse name: Not on file  . Number of children: Not on file  . Years of education: Not on file  . Highest education level: Not on file  Occupational History  . Not on file  Social Needs  . Financial resource strain: Not on file  . Food insecurity:    Worry: Not on file    Inability: Not on  file  . Transportation needs:    Medical: Not on file    Non-medical: Not on file  Tobacco Use  . Smoking status: Never Smoker  . Smokeless tobacco: Never Used  Substance and Sexual Activity  . Alcohol use: No  . Drug use: No  . Sexual activity: Yes    Birth control/protection: None    Comment: pregnant  Lifestyle  . Physical activity:    Days per week: Not on file    Minutes per session: Not on file  . Stress: Not on file  Relationships  . Social connections:    Talks on phone: Not on file    Gets together: Not on file    Attends religious service: Not on file    Active member of club or organization: Not on file    Attends meetings of clubs or organizations: Not on file    Relationship status: Not on file  . Intimate partner violence:    Fear of current or ex partner: Not on file    Emotionally abused: Not on file    Physically abused: Not on file    Forced sexual activity: Not on file  Other Topics Concern  . Not on file  Social History Narrative  . Not on file   Health Maintenance  Topic Date Due  . PAP SMEAR  10/25/2016  . INFLUENZA VACCINE  08/07/2017  . TETANUS/TDAP  11/30/2020  . HIV Screening  Completed       Review of Systems Pertinent items are noted in HPI.   Objective:  Blood pressure 113/71, pulse 65, height 5\' 7"  (1.702 m), weight 193 lb (87.5 kg), last menstrual period 03/24/2017, currently breastfeeding.  GENERAL: Well-developed, well-nourished female in no acute distress.  HEENT: Normocephalic, atraumatic. Sclerae anicteric.  NECK: Supple. Normal thyroid.  LUNGS: Clear to auscultation bilaterally.  HEART: Regular rate and rhythm. BREASTS: Symmetric in size. No palpable masses or lymphadenopathy, skin changes, or nipple drainage. ABDOMEN: Soft, nontender, nondistended. No organomegaly. PELVIC: Normal external female genitalia. Vagina is pink and rugated.  Normal discharge. Normal appearing cervix. Uterus is normal in size. No adnexal mass or  tenderness. EXTREMITIES: No cyanosis, clubbing, or edema, 2+ distal pulses.       Assessment:    Healthy female exam.      Plan:    Pap smear collected STI screen performed per patient request TSH ordered due to irregular cycles Pelvic ultrasound ordered to evaluate pelvic pain Patient will be contacted with any abnormal results Discussed the risks of hyperplasia associated with long term history of anovulatory cycles and the benefits of hormonal contraception in preventing hyperplasia and malignancy. After reviewing option, patient opted for Mirena IUD as she has used it in the past  IUD Procedure Note Patient identified, informed consent performed, signed copy in chart, time out was performed.  Urine pregnancy test negative.  Speculum placed in the vagina.  Cervix visualized.  Cleaned with Betadine x 2.  Grasped anteriorly with a single tooth tenaculum.  Uterus sounded to 9 cm.  Mirena IUD placed per manufacturer's recommendations.  Strings trimmed to 3 cm. Tenaculum was removed, good hemostasis noted.  Patient tolerated procedure well.   Patient given post procedure instructions and Mirena care card with expiration date.  Patient is asked to check IUD strings periodically and follow up in 4-6 weeks for IUD check.   See After Visit Summary for Counseling Recommendations

## 2017-04-07 NOTE — Patient Instructions (Signed)
Diet for Polycystic Ovarian Syndrome Polycystic ovary syndrome (PCOS) is a disorder of the chemical messengers (hormones) that regulate menstruation. The condition causes important hormones to be out of balance. PCOS can:  Make your periods irregular or stop.  Cause cysts to develop on the ovaries.  Make it difficult to get pregnant.  Stop your body from responding to the effects of insulin (insulin resistance), which can lead to obesity and diabetes.  Changing what you eat can help manage PCOS and improve your health. It can help you lose weight and improve the way your body uses insulin. What is my plan?  Eat breakfast, lunch, and dinner plus two snacks every day.  Include protein in each meal and snack.  Choose whole grains instead of products made with refined flour.  Eat a variety of foods.  Exercise regularly as told by your health care provider. What do I need to know about this eating plan? If you are overweight or obese, pay attention to how many calories you eat. Cutting down on calories can help you lose weight. Work with your health care provider or dietitian to figure out how many calories you need each day. What foods can I eat? Grains Whole grains, such as whole wheat. Whole-grain breads, crackers, cereals, and pasta. Unsweetened oatmeal, bulgur, barley, quinoa, or brown rice. Corn or whole-wheat flour tortillas. Vegetables  Lettuce. Spinach. Peas. Beets. Cauliflower. Cabbage. Broccoli. Carrots. Tomatoes. Squash. Eggplant. Herbs. Peppers. Onions. Cucumbers. Brussels sprouts. Fruits Berries. Bananas. Apples. Oranges. Grapes. Papaya. Mango. Pomegranate. Kiwi. Grapefruit. Cherries. Meats and Other Protein Sources Lean proteins, such as fish, chicken, beans, eggs, and tofu. Dairy Low-fat dairy products, such as skim milk, cheese sticks, and yogurt. Beverages Low-fat or fat-free drinks, such as water, low-fat milk, sugar-free drinks, and 100% fruit  juice. Condiments Ketchup. Mustard. Barbecue sauce. Relish. Low-fat or fat-free mayonnaise. Fats and Oils Olive oil or canola oil. Walnuts and almonds. The items listed above may not be a complete list of recommended foods or beverages. Contact your dietitian for more options. What foods are not recommended? Foods high in calories or fat. Fried foods. Sweets. Products made from refined white flour, including white bread, pastries, white rice, and pasta. The items listed above may not be a complete list of foods and beverages to avoid. Contact your dietitian for more information. This information is not intended to replace advice given to you by your health care provider. Make sure you discuss any questions you have with your health care provider. Document Released: 04/17/2015 Document Revised: 06/01/2015 Document Reviewed: 01/05/2014 Elsevier Interactive Patient Education  2018 [employer]  

## 2017-04-07 NOTE — Progress Notes (Signed)
Patient presents for Annual Exam today.  Last pap: 10/25/2013  No hx of abnormal paps  LMP:03/24/17 Contraception: BTL  STD Screening: Full panel    CC: pt states before her March cycle she did not have a cycle for 2 months.  Pt also notes bilateral pelvic pain Pt states she has PCOS    Pt wants to discuss medication change for migraines. Pt has been dealing with migraines x 3 yrs now.

## 2017-04-07 NOTE — Addendum Note (Signed)
Addended by: Tim LairLARK, LATOYA on: 04/07/2017 11:22 AM   Modules accepted: Orders

## 2017-04-08 LAB — RPR: RPR Ser Ql: NONREACTIVE

## 2017-04-08 LAB — HEPATITIS C ANTIBODY: Hep C Virus Ab: <0.1 s/co ratio (ref 0.0–0.9)

## 2017-04-08 LAB — HEPATITIS B SURFACE ANTIGEN: Hepatitis B Surface Ag: NEGATIVE

## 2017-04-08 LAB — HIV ANTIBODY (ROUTINE TESTING W REFLEX): HIV Screen 4th Generation wRfx: NONREACTIVE

## 2017-04-08 LAB — TSH: TSH: 1.87 u[IU]/mL (ref 0.450–4.500)

## 2017-04-09 LAB — CYTOLOGY - PAP
Bacterial vaginitis: NEGATIVE
CHLAMYDIA, DNA PROBE: NEGATIVE
Candida vaginitis: NEGATIVE
Diagnosis: NEGATIVE
HPV (WINDOPATH): NOT DETECTED
Neisseria Gonorrhea: NEGATIVE
TRICH (WINDOWPATH): NEGATIVE

## 2017-04-10 ENCOUNTER — Ambulatory Visit
Admission: RE | Admit: 2017-04-10 | Discharge: 2017-04-10 | Disposition: A | Discharge status: Home / Self Care | Payer: BC Managed Care – PPO | Source: Ambulatory Visit | Attending: Obstetrics and Gynecology | Admitting: Obstetrics and Gynecology

## 2017-04-10 DIAGNOSIS — R102 Pelvic and perineal pain: Secondary | ICD-10-CM

## 2017-05-05 ENCOUNTER — Ambulatory Visit: Payer: BC Managed Care – PPO | Admitting: Obstetrics and Gynecology

## 2017-06-05 ENCOUNTER — Encounter: Payer: Self-pay | Admitting: *Deleted

## 2017-06-05 ENCOUNTER — Encounter: Payer: Self-pay | Admitting: Obstetrics and Gynecology

## 2017-06-05 ENCOUNTER — Ambulatory Visit (INDEPENDENT_AMBULATORY_CARE_PROVIDER_SITE_OTHER): Payer: BC Managed Care – PPO | Admitting: Obstetrics and Gynecology

## 2017-06-05 DIAGNOSIS — Z30432 Encounter for removal of intrauterine contraceptive device: Secondary | ICD-10-CM | POA: Diagnosis not present

## 2017-06-05 DIAGNOSIS — Z975 Presence of (intrauterine) contraceptive device: Principal | ICD-10-CM | POA: Insufficient documentation

## 2017-06-05 DIAGNOSIS — N921 Excessive and frequent menstruation with irregular cycle: Secondary | ICD-10-CM | POA: Insufficient documentation

## 2017-06-05 NOTE — Progress Notes (Signed)
    GYNECOLOGY CLINIC PROCEDURE NOTE  Kathryn Valentine is a 32 y.o. [phone] here for Mirena IUD removal d/t breakthrough bleeding. Has BTL and no need for contraception    IUD Removal  Patient identified, informed consent performed, consent signed.  Patient was in the dorsal lithotomy position, normal external genitalia was noted.  A speculum was placed in the patient's vagina, normal discharge was noted, no lesions. The cervix was visualized, no lesions, no abnormal discharge.  The strings of the IUD were grasped and pulled using ring forceps. The IUD was removed in its entirety.   Patient tolerated the procedure well.     Kathryn Schiavi L. Alysia Penna, MD, FACOG Attending Obstetrician & Gynecologist Center for [facility], [facility], [facility], [facility]

## 2019-12-03 IMAGING — US US PELVIS COMPLETE TRANSABD/TRANSVAG
1 series · 13 of 25 positions shown · non-contrast
Comparison: None

ADDENDUM:
Per clinician, patient has intrauterine device. No definite
intrauterine device is able to be identified on current evaluation.
On a few of the endovaginal images there is suggestion of increased
echogenicity within the endometrium which may represent intrauterine
device however this is unable to be visualized adequately, secondary
to body habitus and uterine lie. Consider repeat pelvic ultrasound
with dedicated attention to the endometrium/suspected intrauterine
device or consider pelvic CT.
CLINICAL DATA: Patient with bilateral pelvic pain.

EXAM:
TRANSABDOMINAL AND TRANSVAGINAL ULTRASOUND OF PELVIS
TECHNIQUE: Both transabdominal and transvaginal ultrasound examinations of the
pelvis were performed. Transabdominal technique was performed for
global imaging of the pelvis including uterus, ovaries, adnexal
regions, and pelvic cul-de-sac. It was necessary to proceed with
endovaginal exam following the transabdominal exam to visualize the
endometrium and adnexal structures.

[Series 1: us pelvis complete transabd/transvag · 0.20mm/px · 13 of 96 slices shown]
[im 1/96]
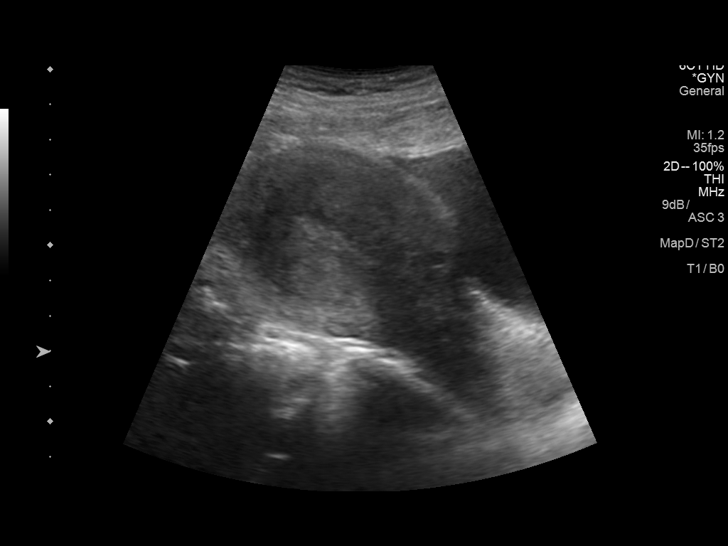
[im 8/96]
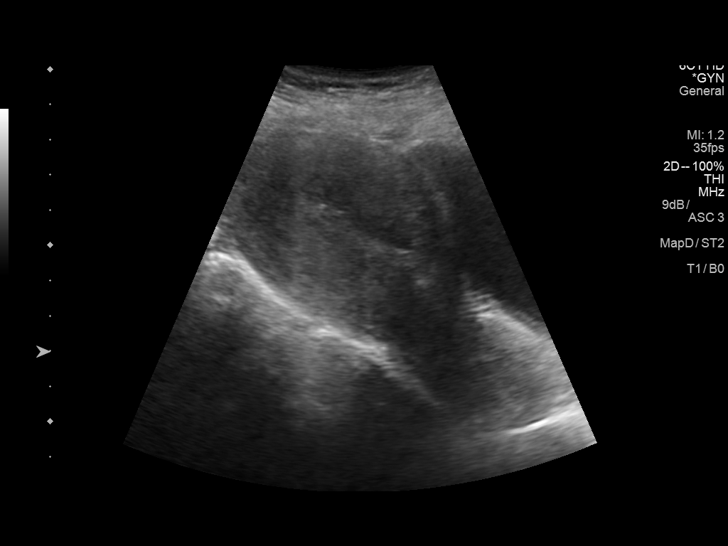
[im 16/96]
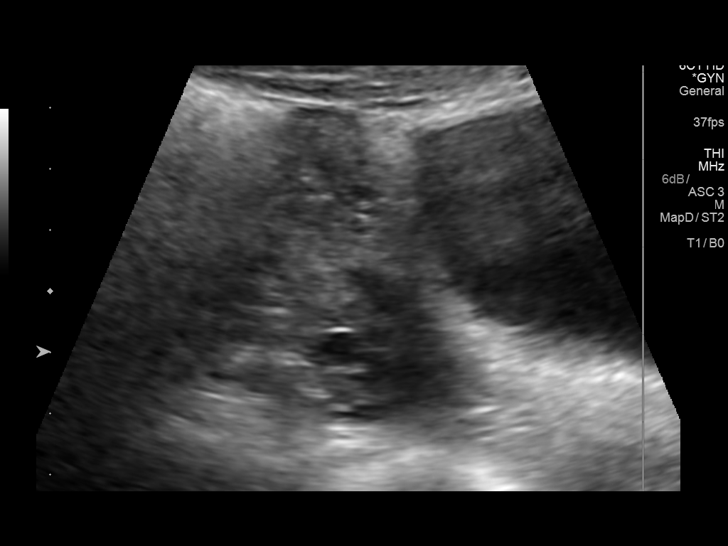
[im 24/96]
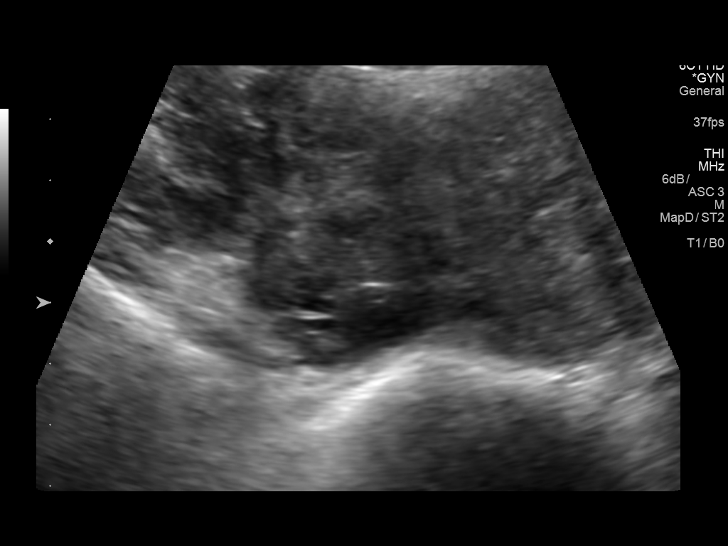
[im 32/96]
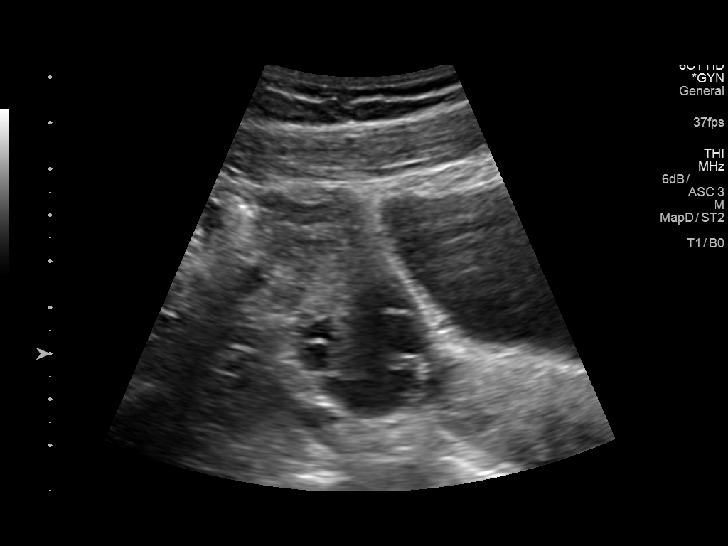
[im 40/96]
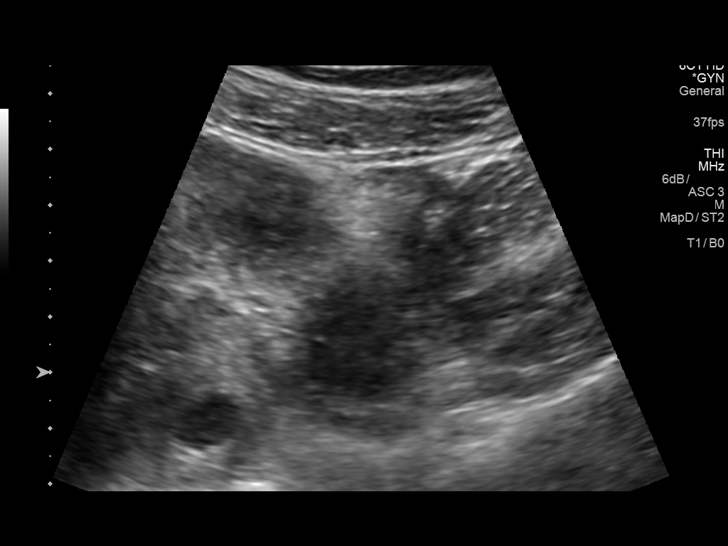
[im 48/96]
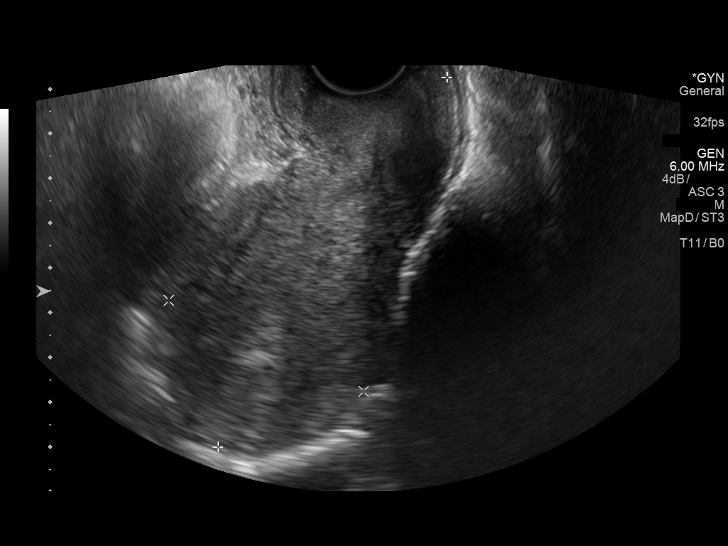
[im 56/96]
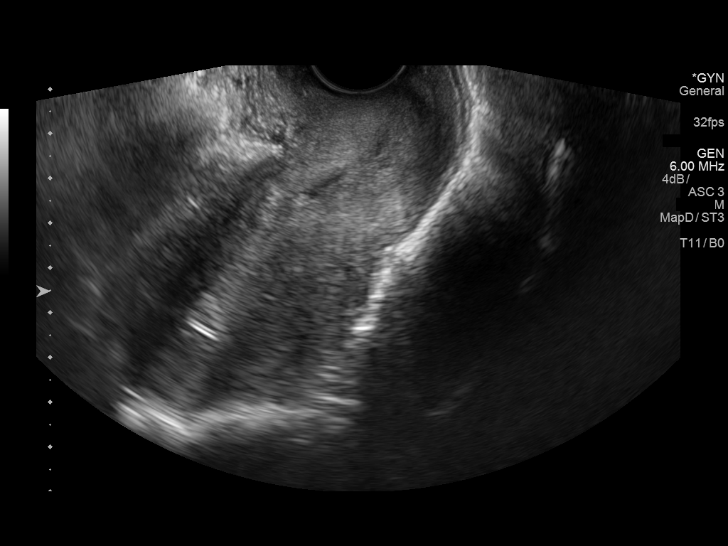
[im 64/96]
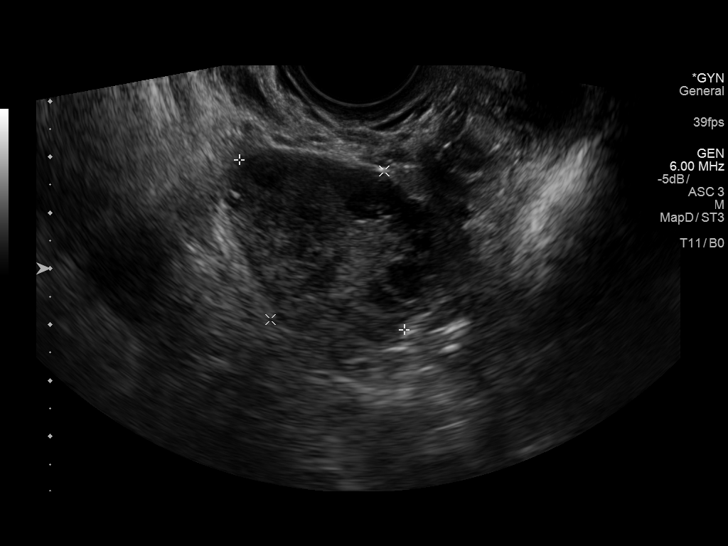
[im 72/96]
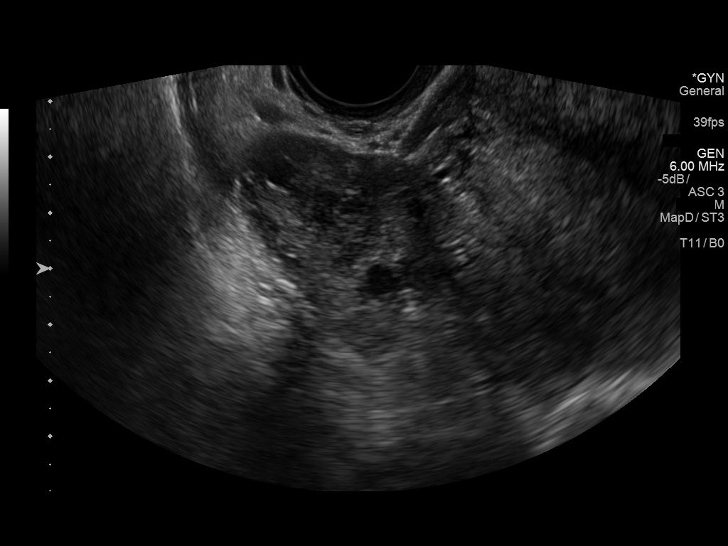
[im 80/96]
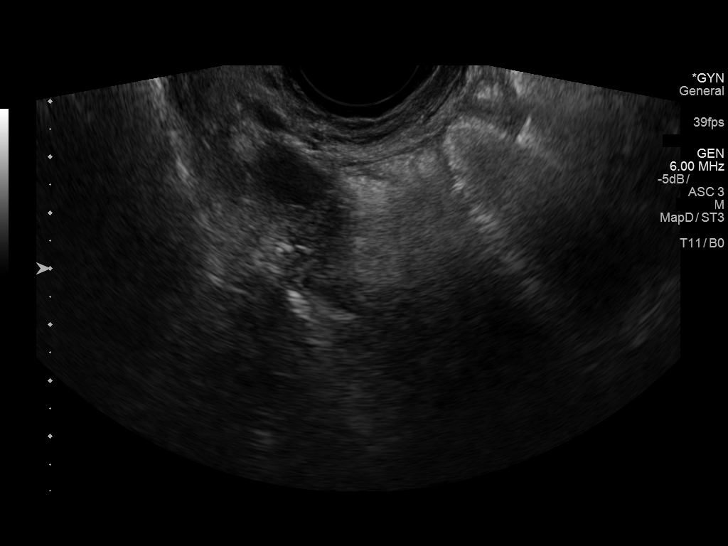
[im 88/96]
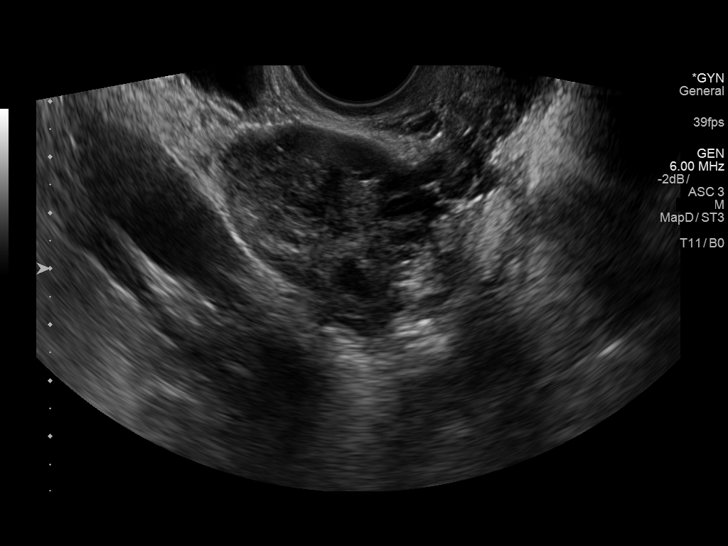
[im 96/96]
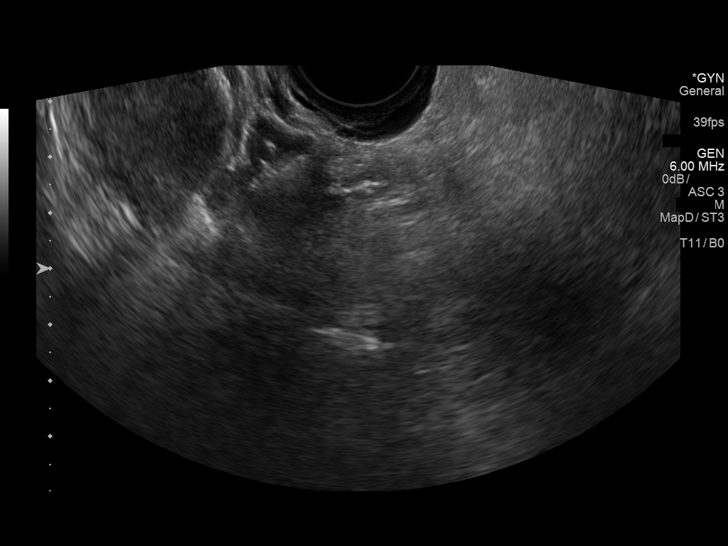

[13 of 25 positions shown; findings below may reference images not displayed]

FINDINGS: Uterus

Measurements: 9.7 x 4.8 x 5.7 cm. No fibroids or other mass
visualized.

Endometrium

Thickness: 9 mm.  No focal abnormality visualized.

Right ovary

Measurements: 4.2 x 3.4 x 3.0 cm. Normal appearance/no adnexal mass.

Left ovary

Measurements: 4.3 x 3.0 x 2.8 cm. Normal appearance/no adnexal mass.

Other findings

No abnormal free fluid.
IMPRESSION: No acute process within the pelvis.
